# Patient Record
Sex: Male | Born: 1990 | Race: White | Hispanic: No | Marital: Single | Smoking: Current every day smoker
Health system: Southern US, Community
[De-identification: ages and names within clinical notes are randomized; demographics above are authoritative.]

## PROBLEM LIST (undated history)

## (undated) DIAGNOSIS — B1920 Unspecified viral hepatitis C without hepatic coma: Secondary | ICD-10-CM

---
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
## 2012-08-09 ENCOUNTER — Emergency Department (HOSPITAL_BASED_OUTPATIENT_CLINIC_OR_DEPARTMENT_OTHER): Payer: Self-pay

## 2012-08-09 ENCOUNTER — Encounter (HOSPITAL_BASED_OUTPATIENT_CLINIC_OR_DEPARTMENT_OTHER): Payer: Self-pay | Admitting: Emergency Medicine

## 2012-08-09 ENCOUNTER — Emergency Department (HOSPITAL_BASED_OUTPATIENT_CLINIC_OR_DEPARTMENT_OTHER)
Admission: EM | Admit: 2012-08-09 | Discharge: 2012-08-09 | Disposition: A | Discharge status: Home / Self Care | Payer: Self-pay | Attending: Emergency Medicine | Admitting: Emergency Medicine

## 2012-08-09 DIAGNOSIS — R071 Chest pain on breathing: Secondary | ICD-10-CM | POA: Insufficient documentation

## 2012-08-09 DIAGNOSIS — R079 Chest pain, unspecified: Secondary | ICD-10-CM

## 2012-08-09 DIAGNOSIS — Z8619 Personal history of other infectious and parasitic diseases: Secondary | ICD-10-CM | POA: Insufficient documentation

## 2012-08-09 DIAGNOSIS — F17200 Nicotine dependence, unspecified, uncomplicated: Secondary | ICD-10-CM | POA: Insufficient documentation

## 2012-08-09 HISTORY — DX: Unspecified viral hepatitis C without hepatic coma: B19.20

## 2012-08-09 LAB — COMPREHENSIVE METABOLIC PANEL
ALT: 55 U/L — ABNORMAL HIGH (ref 0–53)
AST: 31 U/L (ref 0–37)
Calcium: 9.9 mg/dL (ref 8.4–10.5)
GFR calc Af Amer: >90 mL/min (ref >90)
Glucose, Bld: 91 mg/dL (ref 70–99)
Sodium: 140 mEq/L (ref 135–145)
Total Protein: 7.5 g/dL (ref 6.0–8.3)

## 2012-08-09 LAB — CBC WITH DIFFERENTIAL/PLATELET
Basophils Absolute: 0 10*3/uL (ref 0.0–0.1)
Eosinophils Absolute: 0.2 10*3/uL (ref 0.0–0.7)
Eosinophils Relative: 2 % (ref 0–5)
MCH: 29.9 pg (ref 26.0–34.0)
MCHC: 36.8 g/dL — ABNORMAL HIGH (ref 30.0–36.0)
MCV: 81.2 fL (ref 78.0–100.0)
Platelets: 230 10*3/uL (ref 150–400)
RDW: 11.9 % (ref 11.5–15.5)

## 2012-08-09 MED ORDER — ASPIRIN 81 MG PO CHEW
324.0000 mg | CHEWABLE_TABLET | Freq: Once | ORAL | Status: AC
  Administered 2012-08-09: 324 mg via ORAL
  Filled 2012-08-09: qty 4

## 2012-08-09 NOTE — ED Notes (Signed)
Chest pain x 11/2-2 months.  Denies radiation,diaphoresis, nausea  Or  vomiting

## 2012-08-09 NOTE — ED Provider Notes (Signed)
History     CSN: [id number]  Arrival date & time 08/09/12  0023   First MD Initiated Contact with Patient 08/09/12 0050      Chief Complaint  Patient presents with  . Chest Pain    (Consider location/radiation/quality/duration/timing/severity/associated sxs/prior treatment) HPI This is a 21 year old male with about a two-month history of episodic chest pain. The chest pain is located in the left lower chest. It is well localized. It is described as sharp. He knows of nothing that causes it to happen and nothing that causes it to improve. It is sometimes accompanied by palpitations, the sense that his heart is pounding. He is equivocal about there being associated shortness of breath. He is equivocal about there being associated lightheadedness. He denies radiation. He denies diaphoresis. He denies nausea or vomiting. He states the episodes last anywhere from minutes to hours. He is here this evening because he had an episode more severe than previous episodes; symptoms are usually mild to moderate but were more intense this time. It lasted from about 10 PM yesterday evening to about 12:30 this morning.  Past Medical History  Diagnosis Date  . Hepatitis C     History reviewed. No pertinent past surgical history.  No family history on file.  History  Substance Use Topics  . Smoking status: Current Every Day Smoker  . Smokeless tobacco: Not on file  . Alcohol Use: Yes      Review of Systems  All other systems reviewed and are negative.    Allergies  Review of patient's allergies indicates no known allergies.  Home Medications  No current outpatient prescriptions on file.  BP 122/61  Pulse 58  Temp 97.7 F (36.5 C) (Oral)  Resp 20  Ht 6\' 2"  (1.88 m)  Wt 165 lb (74.844 kg)  BMI 21.18 kg/m2  SpO2 100%  Physical Exam General: Well-developed, well-nourished male in no acute distress; appearance consistent with age of record HENT: normocephalic, atraumatic Eyes:  pupils equal round and reactive to light; extraocular muscles intact Neck: supple Heart: regular rate and rhythm; no murmurs, rubs or gallops Lungs: clear to auscultation bilaterally Chest: Nontender Abdomen: soft; nondistended; nontender; no masses or hepatosplenomegaly; bowel sounds present Extremities: No deformity; full range of motion; pulses normal Neurologic: Awake, alert and oriented; motor function intact in all extremities and symmetric; no facial droop Skin: Warm and dry Psychiatric: Normal mood and affect    ED Course  Procedures (including critical care time)    MDM   Nursing notes and vitals signs, including pulse oximetry, reviewed.  Summary of this visit's results, reviewed by myself:  Labs:  Results for orders placed during the hospital encounter of 08/09/12 (from the past 24 hour(s))  TROPONIN I     Status: Normal   Collection Time   08/09/12 12:51 AM      Component Value Range   Troponin I <0.30  <0.30 ng/mL  CBC WITH DIFFERENTIAL     Status: Abnormal   Collection Time   08/09/12 12:51 AM      Component Value Range   WBC 7.1  4.0 - 10.5 K/uL   RBC 4.95  4.22 - 5.81 MIL/uL   Hemoglobin 14.8  13.0 - 17.0 g/dL   HCT 14.7  82.9 - 56.2 %   MCV 81.2  78.0 - 100.0 fL   MCH 29.9  26.0 - 34.0 pg   MCHC 36.8 (*) 30.0 - 36.0 g/dL   RDW 13.0  86.5 - 78.4 %  Platelets 230  150 - 400 K/uL   Neutrophils Relative 50  43 - 77 %   Neutro Abs 3.6  1.7 - 7.7 K/uL   Lymphocytes Relative 41  12 - 46 %   Lymphs Abs 2.9  0.7 - 4.0 K/uL   Monocytes Relative 7  3 - 12 %   Monocytes Absolute 0.5  0.1 - 1.0 K/uL   Eosinophils Relative 2  0 - 5 %   Eosinophils Absolute 0.2  0.0 - 0.7 K/uL   Basophils Relative 0  0 - 1 %   Basophils Absolute 0.0  0.0 - 0.1 K/uL  COMPREHENSIVE METABOLIC PANEL     Status: Abnormal   Collection Time   08/09/12 12:51 AM      Component Value Range   Sodium 140  135 - 145 mEq/L   Potassium 3.4 (*) 3.5 - 5.1 mEq/L   Chloride 102  96 - 112  mEq/L   CO2 25  19 - 32 mEq/L   Glucose, Bld 91  70 - 99 mg/dL   BUN 10  6 - 23 mg/dL   Creatinine, Ser 1.91  0.50 - 1.35 mg/dL   Calcium 9.9  8.4 - 47.8 mg/dL   Total Protein 7.5  6.0 - 8.3 g/dL   Albumin 4.4  3.5 - 5.2 g/dL   AST 31  0 - 37 U/L   ALT 55 (*) 0 - 53 U/L   Alkaline Phosphatase 73  39 - 117 U/L   Total Bilirubin 0.4  0.3 - 1.2 mg/dL   GFR calc non Af Amer >90  >90 mL/min   GFR calc Af Amer >90  >90 mL/min  TROPONIN I     Status: Normal   Collection Time   08/09/12  2:22 AM      Component Value Range   Troponin I <0.30  <0.30 ng/mL    Imaging Studies: Dg Chest 2 View  08/09/2012  *RADIOLOGY REPORT*  Clinical Data:  Chest pain.  CHEST - 2 VIEW  Comparison: 08/02/2012 chest x-ray at [facility]  Findings: The heart size and mediastinal contours are within normal limits.  Both lungs are clear.  The visualized skeletal structures are unremarkable.  IMPRESSION: No active disease.   Original Report Authenticated By: Irish Lack, M.D.    3:05 AM Asymptomatic in ED. Patient was advised to followup with cardiology for evaluation as his symptoms have been going on for several months.    EKG Interpretation:  Date & Time: 08/09/2012 12:40 AM  Rate: 59  Rhythm: sinus bradycardia  QRS Axis: normal  Intervals: normal  ST/T Wave abnormalities: normal  Conduction Disutrbances:none  Narrative Interpretation:   Old EKG Reviewed: Rate is slower          Hanley Seamen, MD 08/09/12 [phone]

## 2014-11-23 ENCOUNTER — Encounter (HOSPITAL_COMMUNITY): Payer: Self-pay

## 2014-11-23 ENCOUNTER — Emergency Department (HOSPITAL_COMMUNITY)
Admission: EM | Admit: 2014-11-23 | Discharge: 2014-11-23 | Disposition: A | Discharge status: Home / Self Care | Payer: Self-pay | Attending: Emergency Medicine | Admitting: Emergency Medicine

## 2014-11-23 ENCOUNTER — Emergency Department (HOSPITAL_COMMUNITY): Payer: Self-pay

## 2014-11-23 DIAGNOSIS — Z8619 Personal history of other infectious and parasitic diseases: Secondary | ICD-10-CM | POA: Insufficient documentation

## 2014-11-23 DIAGNOSIS — Z720 Tobacco use: Secondary | ICD-10-CM | POA: Insufficient documentation

## 2014-11-23 DIAGNOSIS — Z791 Long term (current) use of non-steroidal anti-inflammatories (NSAID): Secondary | ICD-10-CM | POA: Insufficient documentation

## 2014-11-23 DIAGNOSIS — Y9289 Other specified places as the place of occurrence of the external cause: Secondary | ICD-10-CM | POA: Insufficient documentation

## 2014-11-23 DIAGNOSIS — Y9389 Activity, other specified: Secondary | ICD-10-CM | POA: Insufficient documentation

## 2014-11-23 DIAGNOSIS — S6991XA Unspecified injury of right wrist, hand and finger(s), initial encounter: Secondary | ICD-10-CM | POA: Insufficient documentation

## 2014-11-23 DIAGNOSIS — M79641 Pain in right hand: Secondary | ICD-10-CM

## 2014-11-23 DIAGNOSIS — W228XXA Striking against or struck by other objects, initial encounter: Secondary | ICD-10-CM | POA: Insufficient documentation

## 2014-11-23 DIAGNOSIS — Y998 Other external cause status: Secondary | ICD-10-CM | POA: Insufficient documentation

## 2014-11-23 MED ORDER — NAPROXEN 500 MG PO TABS: 500.0000 mg | ORAL_TABLET | Freq: Two times a day (BID) | ORAL | Status: AC

## 2014-11-23 MED ORDER — NAPROXEN 500 MG PO TABS
500.0000 mg | ORAL_TABLET | Freq: Once | ORAL | Status: AC
  Administered 2014-11-23: 500 mg via ORAL
  Filled 2014-11-23: qty 1

## 2014-11-23 NOTE — ED Provider Notes (Signed)
CSN: [id number]     Arrival date & time 11/23/14  1359 History  This chart was scribed for non-physician practitioner, Joycie Peek, PA-C, working with Richardean Canal, MD, by Ronney Lion, ED Scribe. This patient was seen in room WTR5/WTR5 and the patient's care was started at 2:52 PM.    Chief Complaint  Patient presents with  . Hand Injury   The history is provided by the patient. No language interpreter was used.    HPI Comments: Albert Long is a 24 y.o. male who presents to the Emergency Department complaining of stabbing, 8/10, right hand pain that occurred after patient lost his temper and punched a wall 2 days ago. He states he noticed swelling yesterday, which has gradually worsened over time. Nothing makes it better, and patient denies trying any prior treatment or medication for this.  Movement makes it worse. He denies numbness, weakness or fever.   Past Medical History  Diagnosis Date  . Hepatitis C    History reviewed. No pertinent past surgical history. History reviewed. No pertinent family history. History  Substance Use Topics  . Smoking status: Current Every Day Smoker -- 0.25 packs/day    Types: Cigarettes  . Smokeless tobacco: Not on file  . Alcohol Use: No    Review of Systems  Constitutional: Negative for fever.  Musculoskeletal: Positive for joint swelling and arthralgias.  Neurological: Negative for numbness.  All other systems reviewed and are negative.   Allergies  Tylenol  Home Medications   Prior to Admission medications   Medication Sig Start Date End Date Taking? Authorizing Provider  naproxen (NAPROSYN) 500 MG tablet Take 1 tablet (500 mg total) by mouth 2 (two) times daily. 11/23/14   Gleen Ripberger, PA-C   BP 126/76 mmHg  Pulse 87  Temp(Src) 98.3 F (36.8 C) (Oral)  Resp 18  SpO2 98% Physical Exam  Constitutional: He is oriented to person, place, and time. He appears well-developed and well-nourished. No distress.  HENT:  Head:  Normocephalic and atraumatic.  Eyes: Conjunctivae and EOM are normal.  Neck: Neck supple. No tracheal deviation present.  Cardiovascular: Normal rate, regular rhythm and normal heart sounds.   Pulmonary/Chest: Effort normal and breath sounds normal. No respiratory distress.  Lungs are clear to auscultation bilaterally.   Musculoskeletal: Normal range of motion. He exhibits tenderness.  Right hand: Distal pulses intact. NVI. Full ROM of wrist and all digits. No open wounds. Tenderness diffusely to dorsal aspect of palm near the radial head. Diffuse edema across dorsum of palm. No overt warmth or edema to joint capsule. Distal pulses intact. Brisk cap refill 2+. Full ROM of right elbow and shoulder.  Neurological: He is alert and oriented to person, place, and time.  Skin: Skin is warm and dry.  Psychiatric: He has a normal mood and affect. His behavior is normal.  Nursing note and vitals reviewed.   ED Course  Procedures (including critical care time)  SPLINT APPLICATION Date/Time: 3:20 PM Authorized by: Sharlene Motts Consent: Verbal consent obtained. Risks and benefits: risks, benefits and alternatives were discussed Consent given by: patient Splint applied by: orthopedic technician Location details: Right wrist  Splint type: Cock up wrist  Supplies used: Velcro splint  Post-procedure: The splinted body part was neurovascularly unchanged following the procedure. Patient tolerance: Patient tolerated the procedure well with no immediate complications.   ip DIAGNOSTIC STUDIES: Oxygen Saturation is 98% on room air, normal by my interpretation.    COORDINATION OF CARE: 2:58 PM -  Discussed treatment plan with pt at bedside which includes pain medication, and pt agreed to plan.   Labs Review Labs Reviewed - No data to display  Imaging Review Dg Hand Complete Right  11/23/2014   CLINICAL DATA:  Punched wall Sunday.  Pain  EXAM: RIGHT HAND - COMPLETE 3+ VIEW  COMPARISON:   06/15/2010  FINDINGS: There is no evidence of fracture or dislocation. There is no evidence of arthropathy or other focal bone abnormality. Soft tissues are unremarkable.  IMPRESSION: Negative.   Electronically Signed   By: Marlan Palauharles  Clark M.D.   On: 11/23/2014 14:49   Meds given in ED:  Medications  naproxen (NAPROSYN) tablet 500 mg (500 mg Oral Given 11/23/14 1505)    New Prescriptions   NAPROXEN (NAPROSYN) 500 MG TABLET    Take 1 tablet (500 mg total) by mouth 2 (two) times daily.   Filed Vitals:   11/23/14 1404  BP: 126/76  Pulse: 87  Temp: 98.3 F (36.8 C)  TempSrc: Oral  Resp: 18  SpO2: 98%    MDM  Vitals stable - WNL -afebrile Pt resting comfortably in ED. PE--no evidence of hemarthrosis, septic joint or other vascular compromise. Normal neuro exam Imaging--plain films of right hand and wrist are negative  DDX--patient with soft tissue swelling secondary to punching a wall. No evidence of acute fracture or dislocation. Will place patient in a cockup wrist splint for comfort, discharged with anti-inflammatories, instructions for rice therapy and referral to orthopedics should symptoms persist or worsen.  I discussed all relevant lab findings and imaging results with pt and they verbalized understanding. Discussed f/u with PCP within 48 hrs and return precautions, pt very amenable to plan.  Final diagnoses:  Right hand pain    I personally performed the services described in this documentation, which was scribed in my presence. The recorded information has been reviewed and is accurate.      Joycie PeekBenjamin Tanvir Hipple, PA-C 11/23/14 1521  Richardean Canalavid H Yao, MD 11/23/14 [phone]

## 2014-11-23 NOTE — ED Notes (Signed)
Pt c/o R hand injury after punching a wall x 2 days ago.  Pain score 7/10.  Pt has not taken anything for pain.  Swelling noted.

## 2014-11-23 NOTE — Discharge Instructions (Signed)
Musculoskeletal Pain Musculoskeletal pain is muscle and boney aches and pains. These pains can occur in any part of the body. Your caregiver may treat you without knowing the cause of the pain. They may treat you if blood or urine tests, X-rays, and other tests were normal.  CAUSES There is often not a definite cause or reason for these pains. These pains may be caused by a type of germ (virus). The discomfort may also come from overuse. Overuse includes working out too hard when your body is not fit. Boney aches also come from weather changes. Bone is sensitive to atmospheric pressure changes. HOME CARE INSTRUCTIONS   Ask when your test results will be ready. Make sure you get your test results.  Only take over-the-counter or prescription medicines for pain, discomfort, or fever as directed by your caregiver. If you were given medications for your condition, do not drive, operate machinery or power tools, or sign legal documents for 24 hours. Do not drink alcohol. Do not take sleeping pills or other medications that may interfere with treatment.  Continue all activities unless the activities cause more pain. When the pain lessens, slowly resume normal activities. Gradually increase the intensity and duration of the activities or exercise.  During periods of severe pain, bed rest may be helpful. Lay or sit in any position that is comfortable.  Putting ice on the injured area.  Put ice in a bag.  Place a towel between your skin and the bag.  Leave the ice on for 15 to 20 minutes, 3 to 4 times a day.  Follow up with your caregiver for continued problems and no reason can be found for the pain. If the pain becomes worse or does not go away, it may be necessary to repeat tests or do additional testing. Your caregiver may need to look further for a possible cause. SEEK IMMEDIATE MEDICAL CARE IF:  You have pain that is getting worse and is not relieved by medications.  You develop chest pain  that is associated with shortness or breath, sweating, feeling sick to your stomach (nauseous), or throw up (vomit).  Your pain becomes localized to the abdomen.  You develop any new symptoms that seem different or that concern you. MAKE SURE YOU:   Understand these instructions.  Will watch your condition.  Will get help right away if you are not doing well or get worse. Document Released: 08/27/2005 Document Revised: 11/19/2011 Document Reviewed: 05/01/2013 [facility] Patient Information 2015 [employer], [place]. This information is not intended to replace advice given to you by your health care provider. Make sure you discuss any questions you have with your health care provider.  You were evaluated in the ED today for your hand pain after punching a wall. There does not appear to be an emergent cause for your symptoms at this time. Your x-rays were done and are negative for any broken bones or dislocations. Please take your medications as record for your discomfort and inflammation. Please follow-up with your primary care for further evaluation and management of your symptoms. Return to ED for new or worsening symptoms.

## 2018-07-21 ENCOUNTER — Encounter (HOSPITAL_COMMUNITY): Payer: Self-pay | Admitting: Emergency Medicine

## 2018-07-21 ENCOUNTER — Emergency Department (HOSPITAL_COMMUNITY): Payer: Self-pay

## 2018-07-21 ENCOUNTER — Emergency Department (HOSPITAL_COMMUNITY)
Admission: EM | Admit: 2018-07-21 | Discharge: 2018-07-21 | Disposition: A | Discharge status: Home / Self Care | Payer: Self-pay | Attending: Emergency Medicine | Admitting: Emergency Medicine

## 2018-07-21 DIAGNOSIS — T401X1A Poisoning by heroin, accidental (unintentional), initial encounter: Secondary | ICD-10-CM | POA: Insufficient documentation

## 2018-07-21 DIAGNOSIS — B1710 Acute hepatitis C without hepatic coma: Secondary | ICD-10-CM | POA: Insufficient documentation

## 2018-07-21 DIAGNOSIS — F17210 Nicotine dependence, cigarettes, uncomplicated: Secondary | ICD-10-CM | POA: Insufficient documentation

## 2018-07-21 DIAGNOSIS — W19XXXA Unspecified fall, initial encounter: Secondary | ICD-10-CM | POA: Insufficient documentation

## 2018-07-21 DIAGNOSIS — S0181XA Laceration without foreign body of other part of head, initial encounter: Secondary | ICD-10-CM | POA: Insufficient documentation

## 2018-07-21 DIAGNOSIS — Y998 Other external cause status: Secondary | ICD-10-CM | POA: Insufficient documentation

## 2018-07-21 DIAGNOSIS — S0990XA Unspecified injury of head, initial encounter: Secondary | ICD-10-CM | POA: Insufficient documentation

## 2018-07-21 DIAGNOSIS — N50819 Testicular pain, unspecified: Secondary | ICD-10-CM | POA: Insufficient documentation

## 2018-07-21 DIAGNOSIS — Z23 Encounter for immunization: Secondary | ICD-10-CM | POA: Insufficient documentation

## 2018-07-21 DIAGNOSIS — Y9389 Activity, other specified: Secondary | ICD-10-CM | POA: Insufficient documentation

## 2018-07-21 DIAGNOSIS — Y9289 Other specified places as the place of occurrence of the external cause: Secondary | ICD-10-CM | POA: Insufficient documentation

## 2018-07-21 LAB — RAPID URINE DRUG SCREEN, HOSP PERFORMED
Amphetamines: NOT DETECTED
Barbiturates: NOT DETECTED
Benzodiazepines: NOT DETECTED
Cocaine: NOT DETECTED
Opiates: POSITIVE — AB
Tetrahydrocannabinol: NOT DETECTED

## 2018-07-21 LAB — COMPREHENSIVE METABOLIC PANEL
ALK PHOS: 65 U/L (ref 38–126)
ALT: 95 U/L — AB (ref 0–44)
AST: 45 U/L — AB (ref 15–41)
Albumin: 4.1 g/dL (ref 3.5–5.0)
Anion gap: 9 (ref 5–15)
BUN: 17 mg/dL (ref 6–20)
CALCIUM: 9.4 mg/dL (ref 8.9–10.3)
CHLORIDE: 100 mmol/L (ref 98–111)
CO2: 31 mmol/L (ref 22–32)
CREATININE: 0.9 mg/dL (ref 0.61–1.24)
GFR calc Af Amer: >60 mL/min (ref >60)
GFR calc non Af Amer: >60 mL/min (ref >60)
Glucose, Bld: 111 mg/dL — ABNORMAL HIGH (ref 70–99)
Potassium: 3.3 mmol/L — ABNORMAL LOW (ref 3.5–5.1)
Sodium: 140 mmol/L (ref 135–145)
Total Bilirubin: 0.8 mg/dL (ref 0.3–1.2)
Total Protein: 6.9 g/dL (ref 6.5–8.1)

## 2018-07-21 LAB — CBC WITH DIFFERENTIAL/PLATELET
Abs Immature Granulocytes: 0.03 10*3/uL (ref 0.00–0.07)
Basophils Absolute: 0 10*3/uL (ref 0.0–0.1)
Basophils Relative: 0 %
EOS ABS: 0 10*3/uL (ref 0.0–0.5)
EOS PCT: 0 %
HEMATOCRIT: 42.5 % (ref 39.0–52.0)
HEMOGLOBIN: 14.4 g/dL (ref 13.0–17.0)
Immature Granulocytes: 0 %
LYMPHS ABS: 1.2 10*3/uL (ref 0.7–4.0)
LYMPHS PCT: 12 %
MCH: 30.3 pg (ref 26.0–34.0)
MCHC: 33.9 g/dL (ref 30.0–36.0)
MCV: 89.3 fL (ref 80.0–100.0)
MONO ABS: 0.9 10*3/uL (ref 0.1–1.0)
Monocytes Relative: 9 %
Neutro Abs: 7.8 10*3/uL — ABNORMAL HIGH (ref 1.7–7.7)
Neutrophils Relative %: 79 %
Platelets: 157 10*3/uL (ref 150–400)
RBC: 4.76 MIL/uL (ref 4.22–5.81)
RDW: 11.9 % (ref 11.5–15.5)
WBC: 10 10*3/uL (ref 4.0–10.5)
nRBC: 0 % (ref 0.0–0.2)

## 2018-07-21 LAB — URINALYSIS, ROUTINE W REFLEX MICROSCOPIC
BILIRUBIN URINE: NEGATIVE
Bacteria, UA: NONE SEEN
Glucose, UA: NEGATIVE mg/dL
HGB URINE DIPSTICK: NEGATIVE
Ketones, ur: 5 mg/dL — AB
LEUKOCYTES UA: NEGATIVE
NITRITE: NEGATIVE
PH: 6 (ref 5.0–8.0)
Protein, ur: 30 mg/dL — AB
SPECIFIC GRAVITY, URINE: 1.018 (ref 1.005–1.030)

## 2018-07-21 LAB — ETHANOL

## 2018-07-21 MED ORDER — NALOXONE HCL 4 MG/0.1ML NA LIQD
1.0000 | Freq: Once | NASAL | Status: AC | PRN
  Administered 2018-07-21: 1 via NASAL
  Filled 2018-07-21: qty 4

## 2018-07-21 MED ORDER — TETANUS-DIPHTH-ACELL PERTUSSIS 5-2.5-18.5 LF-MCG/0.5 IM SUSP
0.5000 mL | Freq: Once | INTRAMUSCULAR | Status: AC
  Administered 2018-07-21: 0.5 mL via INTRAMUSCULAR
  Filled 2018-07-21: qty 0.5

## 2018-07-21 MED ORDER — LIDOCAINE HCL (PF) 1 % IJ SOLN
5.0000 mL | Freq: Once | INTRAMUSCULAR | Status: AC
  Administered 2018-07-21: 5 mL
  Filled 2018-07-21: qty 30

## 2018-07-21 NOTE — ED Triage Notes (Signed)
Pt found unresponsive in bathroom, fall and lac to right eye , pt admits to using heroin. Pt given 1 mg narcan, 4 zofran, 18 in LAC.  Vs 133/93, hr 102, rr18, spo2 96 room air.

## 2018-07-21 NOTE — ED Notes (Signed)
Bed: RESA Expected date:  Expected time:  Means of arrival:  Comments: 27 yo heroin overdose

## 2018-07-21 NOTE — Discharge Instructions (Addendum)
Follow-up with urology for the testicle pain. Sutures out in 7 days

## 2018-07-21 NOTE — ED Provider Notes (Signed)
[facility]-EMERGENCY DEPT Provider Note   CSN: [id number] Arrival date & time: 07/21/18  2014     History   Chief Complaint Chief Complaint  Patient presents with  . Drug Overdose    heroin od     HPI Albert Long is a 27 y.o. male.  HPI Patient presents after drug overdose.  States he was in the bathroom and used heroin.  Found unresponsive.  States he had been in jail and has not used heroin much recently.  Injected into his arm.  Laceration above right eye.  States he has pain in his head.  Is not sure his last tetanus shot.  History of hepatitis C.  Denies suicidal homicidal thoughts. Past Medical History:  Diagnosis Date  . Hepatitis C     There are no active problems to display for this patient.   History reviewed. No pertinent surgical history.      Home Medications    Prior to Admission medications   Medication Sig Start Date End Date Taking? Authorizing Provider  naproxen (NAPROSYN) 500 MG tablet Take 1 tablet (500 mg total) by mouth 2 (two) times daily. 11/23/14   Joycie Peek, PA-C    Family History No family history on file.  Social History Social History   Tobacco Use  . Smoking status: Current Every Day Smoker    Packs/day: 0.25    Types: Cigarettes  Substance Use Topics  . Alcohol use: No  . Drug use: No     Allergies   Tylenol [acetaminophen]   Review of Systems Review of Systems  Constitutional: Negative for appetite change.  HENT: Negative for congestion.   Respiratory: Negative for shortness of breath.   Cardiovascular: Negative for chest pain.  Gastrointestinal: Negative for abdominal pain.  Genitourinary: Negative for discharge.  Musculoskeletal: Negative for back pain.  Skin: Positive for wound.  Neurological: Negative for weakness.  Psychiatric/Behavioral: Negative for suicidal ideas.     Physical Exam Updated Vital Signs BP 134/73 (BP Location: Right Arm)   Pulse 97   Temp 98.9 F  (37.2 C) (Oral)   Resp 18   Ht 6\' 2"  (1.88 m)   Wt 93 kg   SpO2 98%   BMI 26.32 kg/m   Physical Exam  Constitutional: He is oriented to person, place, and time. He appears well-developed.  HENT:  Head: Normocephalic.  2 cm laceration above right eyebrow.  Tenderness right temple and right cheek.  Eyes: Pupils are equal, round, and reactive to light. EOM are normal.  Neck: Neck supple.  Cardiovascular: Normal rate.  Pulmonary/Chest: He has no wheezes.  Abdominal: There is no tenderness.  Musculoskeletal: He exhibits no edema.  Neurological: He is alert and oriented to person, place, and time.  Skin: Skin is warm. Capillary refill takes less than 2 seconds.     ED Treatments / Results  Labs (all labs ordered are listed, but only abnormal results are displayed) Labs Reviewed  COMPREHENSIVE METABOLIC PANEL - Abnormal; Notable for the following components:      Result Value   Potassium 3.3 (*)    Glucose, Bld 111 (*)    AST 45 (*)    ALT 95 (*)    All other components within normal limits  CBC WITH DIFFERENTIAL/PLATELET - Abnormal; Notable for the following components:   Neutro Abs 7.8 (*)    All other components within normal limits  RAPID URINE DRUG SCREEN, HOSP PERFORMED - Abnormal; Notable for the following components:  Opiates POSITIVE (*)    All other components within normal limits  URINALYSIS, ROUTINE W REFLEX MICROSCOPIC - Abnormal; Notable for the following components:   Ketones, ur 5 (*)    Protein, ur 30 (*)    All other components within normal limits  ETHANOL  RPR  HIV ANTIBODY (ROUTINE TESTING W REFLEX)  GC/CHLAMYDIA PROBE AMP ([place]) NOT AT [facility]    EKG None  Radiology Dg Ribs Unilateral W/chest Left  Result Date: 07/21/2018 CLINICAL DATA:  Chest pain EXAM: LEFT RIBS AND CHEST - 3+ VIEW COMPARISON:  03/25/2013 FINDINGS: No fracture or other bone lesions are seen involving the ribs. There is no evidence of pneumothorax or pleural effusion.  Both lungs are clear. Heart size and mediastinal contours are within normal limits. Radiodense material over the stomach. IMPRESSION: Negative. Electronically Signed   By: Jasmine Pang M.D.   On: 07/21/2018 23:00   Ct Head Wo Contrast  Result Date: 07/21/2018 CLINICAL DATA:  27 year old male status post 1 unwitnessed fall. Head, right orbit laceration. EXAM: CT HEAD WITHOUT CONTRAST CT MAXILLOFACIAL WITHOUT CONTRAST TECHNIQUE: Multidetector CT imaging of the head and maxillofacial structures were performed using the standard protocol without intravenous contrast. Multiplanar CT image reconstructions of the maxillofacial structures were also generated. COMPARISON:  Head CT 03/10/2010, head and face CT 04/20/2008. FINDINGS: CT HEAD FINDINGS Brain: Normal cerebral volume. No midline shift, ventriculomegaly, mass effect, evidence of mass lesion, intracranial hemorrhage or evidence of cortically based acute infarction. Gray-white matter differentiation is within normal limits throughout the brain. Vascular: No suspicious intracranial vascular hyperdensity. Skull: Stable and intact. Other: Periorbital soft tissues described below. No other scalp soft tissue gas. Posterior right scalp contusion or hematoma suspected on series 4, image 69. Possible additional scalp soft tissue injury along the left forehead on image 53. Underlying calvarium intact. CT MAXILLOFACIAL FINDINGS Osseous: Mandible intact. Intact maxilla and zygoma. Nasal bones appear stable and intact. Visible cervical vertebrae appear intact. Orbits: Orbital walls appear stable and intact. Right globe is intact. The intraorbital soft tissues remain normal. Right superior periorbital soft tissue swelling with trace soft tissue gas. Normal left orbit. Sinuses: Paranasal sinuses and mastoids are stable and well pneumatized. Right petrous apex air cells (normal variant). Soft tissues: Negative visible noncontrast larynx, pharynx, parapharyngeal spaces,  retropharyngeal space, sublingual space, submandibular glands, parotid glands, masticator spaces. Negative upper cervical lymph nodes. IMPRESSION: 1. Right periorbital and scattered scalp soft tissue injuries without underlying fracture. No intraorbital injury. 2. Stable and normal noncontrast CT appearance of the brain. Electronically Signed   By: Odessa Fleming M.D.   On: 07/21/2018 20:56   Ct Maxillofacial Wo Contrast  Result Date: 07/21/2018 CLINICAL DATA:  27 year old male status post 1 unwitnessed fall. Head, right orbit laceration. EXAM: CT HEAD WITHOUT CONTRAST CT MAXILLOFACIAL WITHOUT CONTRAST TECHNIQUE: Multidetector CT imaging of the head and maxillofacial structures were performed using the standard protocol without intravenous contrast. Multiplanar CT image reconstructions of the maxillofacial structures were also generated. COMPARISON:  Head CT 03/10/2010, head and face CT 04/20/2008. FINDINGS: CT HEAD FINDINGS Brain: Normal cerebral volume. No midline shift, ventriculomegaly, mass effect, evidence of mass lesion, intracranial hemorrhage or evidence of cortically based acute infarction. Gray-white matter differentiation is within normal limits throughout the brain. Vascular: No suspicious intracranial vascular hyperdensity. Skull: Stable and intact. Other: Periorbital soft tissues described below. No other scalp soft tissue gas. Posterior right scalp contusion or hematoma suspected on series 4, image 69. Possible additional scalp soft tissue injury along  the left forehead on image 53. Underlying calvarium intact. CT MAXILLOFACIAL FINDINGS Osseous: Mandible intact. Intact maxilla and zygoma. Nasal bones appear stable and intact. Visible cervical vertebrae appear intact. Orbits: Orbital walls appear stable and intact. Right globe is intact. The intraorbital soft tissues remain normal. Right superior periorbital soft tissue swelling with trace soft tissue gas. Normal left orbit. Sinuses: Paranasal sinuses  and mastoids are stable and well pneumatized. Right petrous apex air cells (normal variant). Soft tissues: Negative visible noncontrast larynx, pharynx, parapharyngeal spaces, retropharyngeal space, sublingual space, submandibular glands, parotid glands, masticator spaces. Negative upper cervical lymph nodes. IMPRESSION: 1. Right periorbital and scattered scalp soft tissue injuries without underlying fracture. No intraorbital injury. 2. Stable and normal noncontrast CT appearance of the brain. Electronically Signed   By: Odessa Fleming M.D.   On: 07/21/2018 20:56    Procedures .Marland KitchenLaceration Repair Date/Time: 07/21/2018 11:24 PM Performed by: Benjiman Core, MD Authorized by: Benjiman Core, MD   Consent:    Consent obtained:  Verbal   Consent given by:  Patient   Risks discussed:  Infection, need for additional repair, poor cosmetic result, pain, retained foreign body and poor wound healing   Alternatives discussed:  No treatment Anesthesia (see MAR for exact dosages):    Anesthesia method:  Local infiltration   Local anesthetic:  Lidocaine 1% w/o epi Laceration details:    Location:  Face   Face location:  Forehead   Length (cm):  2 Repair type:    Repair type:  Simple Pre-procedure details:    Preparation:  Patient was prepped and draped in usual sterile fashion Exploration:    Wound exploration: wound explored through full range of motion     Contaminated: no   Treatment:    Area cleansed with:  Saline   Amount of cleaning:  Standard Skin repair:    Repair method:  Sutures   Suture size:  5-0   Wound skin closure material used: vicryl rapide.   Suture technique:  Simple interrupted   Number of sutures:  6 Approximation:    Approximation:  Close Post-procedure details:    Patient tolerance of procedure:  Tolerated well, no immediate complications   (including critical care time)  Medications Ordered in ED Medications  naloxone (NARCAN) nasal spray 4 mg/0.1 mL (has no  administration in time range)  Tdap (BOOSTRIX) injection 0.5 mL (0.5 mLs Intramuscular Given 07/21/18 2224)  lidocaine (PF) (XYLOCAINE) 1 % injection 5 mL (5 mLs Infiltration Given by Other 07/21/18 2223)     Initial Impression / Assessment and Plan / ED Course  I have reviewed the triage vital signs and the nursing notes.  Pertinent labs & imaging results that were available during my care of the patient were reviewed by me and considered in my medical decision making (see chart for details).     Patient with heroin overdose.  Accidental.  Now appears to be stable.  Also was complaining of left chest pain.  X-ray reassuring.  Laceration closed.  Head CT reassuring.  Patient and his girlfriend complaining of testicle pain.  States is been going for a year.  No tach clear testicle tenderness.  No penile discharge.  Patient refused ultrasound because there was only a male tech.  Will follow with urology.  Given nasal Narcan to take home for risk reduction.  Discharge home per  Final Clinical Impressions(s) / ED Diagnoses   Final diagnoses:  Accidental overdose of heroin, initial encounter (HCC)  Testicle pain  ED Discharge Orders    None       Benjiman Core, MD 07/21/18 2326

## 2018-07-21 NOTE — ED Notes (Signed)
Suture cart outside of room with suture tray.

## 2018-07-21 NOTE — ED Notes (Signed)
Pt in Ct  

## 2018-07-22 LAB — HIV ANTIBODY (ROUTINE TESTING W REFLEX): HIV Screen 4th Generation wRfx: NONREACTIVE

## 2018-07-22 LAB — RPR: RPR Ser Ql: NONREACTIVE

## 2018-07-22 LAB — GC/CHLAMYDIA PROBE AMP (~~LOC~~) NOT AT ARMC
Chlamydia: NEGATIVE
Neisseria Gonorrhea: NEGATIVE

## 2018-08-15 ENCOUNTER — Encounter (HOSPITAL_COMMUNITY): Payer: Self-pay | Admitting: Emergency Medicine

## 2018-08-15 ENCOUNTER — Other Ambulatory Visit: Payer: Self-pay

## 2018-08-15 ENCOUNTER — Emergency Department (HOSPITAL_COMMUNITY)
Admission: EM | Admit: 2018-08-15 | Discharge: 2018-08-16 | Disposition: A | Discharge status: Home / Self Care | Payer: Self-pay | Attending: Emergency Medicine | Admitting: Emergency Medicine

## 2018-08-15 ENCOUNTER — Emergency Department (HOSPITAL_COMMUNITY): Payer: Self-pay

## 2018-08-15 DIAGNOSIS — F17210 Nicotine dependence, cigarettes, uncomplicated: Secondary | ICD-10-CM | POA: Insufficient documentation

## 2018-08-15 DIAGNOSIS — L03113 Cellulitis of right upper limb: Secondary | ICD-10-CM | POA: Insufficient documentation

## 2018-08-15 LAB — CBC WITH DIFFERENTIAL/PLATELET
ABS IMMATURE GRANULOCYTES: 0.01 10*3/uL (ref 0.00–0.07)
BASOS ABS: 0 10*3/uL (ref 0.0–0.1)
BASOS PCT: 0 %
Eosinophils Absolute: 0.2 10*3/uL (ref 0.0–0.5)
Eosinophils Relative: 3 %
HCT: 42.4 % (ref 39.0–52.0)
Hemoglobin: 13.9 g/dL (ref 13.0–17.0)
Immature Granulocytes: 0 %
Lymphocytes Relative: 24 %
Lymphs Abs: 1.5 10*3/uL (ref 0.7–4.0)
MCH: 29.1 pg (ref 26.0–34.0)
MCHC: 32.8 g/dL (ref 30.0–36.0)
MCV: 88.9 fL (ref 80.0–100.0)
MONO ABS: 0.6 10*3/uL (ref 0.1–1.0)
Monocytes Relative: 9 %
NEUTROS ABS: 4 10*3/uL (ref 1.7–7.7)
NRBC: 0 % (ref 0.0–0.2)
Neutrophils Relative %: 64 %
PLATELETS: 219 10*3/uL (ref 150–400)
RBC: 4.77 MIL/uL (ref 4.22–5.81)
RDW: 12.1 % (ref 11.5–15.5)
WBC: 6.3 10*3/uL (ref 4.0–10.5)

## 2018-08-15 LAB — BASIC METABOLIC PANEL
Anion gap: 9 (ref 5–15)
BUN: 13 mg/dL (ref 6–20)
CALCIUM: 9.3 mg/dL (ref 8.9–10.3)
CO2: 29 mmol/L (ref 22–32)
CREATININE: 1 mg/dL (ref 0.61–1.24)
Chloride: 100 mmol/L (ref 98–111)
GFR calc Af Amer: >60 mL/min (ref >60)
GLUCOSE: 114 mg/dL — AB (ref 70–99)
Potassium: 4 mmol/L (ref 3.5–5.1)
Sodium: 138 mmol/L (ref 135–145)

## 2018-08-15 LAB — SEDIMENTATION RATE: SED RATE: 18 mm/h — AB (ref 0–16)

## 2018-08-15 LAB — C-REACTIVE PROTEIN: CRP: 8.5 mg/dL — ABNORMAL HIGH (ref <1.0)

## 2018-08-15 MED ORDER — VANCOMYCIN HCL IN DEXTROSE 1-5 GM/200ML-% IV SOLN: 1000.0000 mg | Freq: Once | INTRAVENOUS | Status: DC

## 2018-08-15 MED ORDER — CLINDAMYCIN HCL 150 MG PO CAPS: 450.0000 mg | ORAL_CAPSULE | Freq: Three times a day (TID) | ORAL | 0 refills | Status: AC

## 2018-08-15 MED ORDER — VANCOMYCIN HCL 10 G IV SOLR
1750.0000 mg | Freq: Once | INTRAVENOUS | Status: AC
  Administered 2018-08-15: 1750 mg via INTRAVENOUS
  Filled 2018-08-15: qty 1750

## 2018-08-15 MED ORDER — SODIUM CHLORIDE 0.9 % IV SOLN
2.0000 g | Freq: Once | INTRAVENOUS | Status: AC
  Administered 2018-08-15: 2 g via INTRAVENOUS
  Filled 2018-08-15: qty 20

## 2018-08-15 NOTE — ED Provider Notes (Signed)
Medical screening examination/treatment/procedure(s) were conducted as a shared visit with non-physician practitioner(s) and myself.  I personally evaluated the patient during the encounter.  Clinical Impression:   Final diagnoses:  None    27 year old male known history of IV drug use presents with swelling of his right hand wrist and distal forearm.  There is no focal area of fluctuance, he has a mild redness and swelling to this area and some tenderness with trying to make a fist.  He has good pulses, good capillary refill, normal sensation.  There is no definite drainable abscess, no lymphadenopathy in the epitrochlear or axillary region.  He is not tachycardic, he is overall well-appearing.  At this time he will be treated with antibiotics and follow-up.   Eber HongMiller, Bricelyn Freestone, MD 08/16/18 [phone]

## 2018-08-15 NOTE — Discharge Instructions (Signed)
Take clindamycin as prescribed until finished.  Do not miss any doses.  We recommend that you take this antibiotic with a probiotic.  This can be purchased over-the-counter at your local pharmacy.  Should you desire evaluation regarding the foreign bodies in your arm, follow-up with a hand specialist.  Return to the emergency department if symptoms persist or worsen in your right hand/arm or if you develop a fever over 101F with persistent swelling. Discontinue use of IV drugs.

## 2018-08-15 NOTE — ED Provider Notes (Signed)
[facility] EMERGENCY DEPARTMENT Provider Note   CSN: [id number] Arrival date & time: 08/15/18  2015     History   Chief Complaint Chief Complaint  Patient presents with  . hand swelling    HPI Albert Long is a 27 y.o. male.  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 Patient gave permission to discuss all aspects of this encounter with girlfriend present prior to obtaining history.  Patient reporting 4 days of progressive swelling of the right hand.  This has spread to his wrist and forearm.  He has been taking Tylenol and ibuprofen for symptoms without relief.  Girlfriend reports that she has begun to notice mild redness to the skin.  Denies any history of trauma, fall, other injury.  No known bug bites.  Patient denies using IV drugs of the site of pain and swelling.  Did present for overdose on heroin approximately 1 month ago.  Is not appreciated to be fully forthcoming with his drug history.  Patient denies fever, numbness, paresthesias, weakness, drainage from RUE.     Past Medical History:  Diagnosis Date  . Hepatitis C     There are no active problems to display for this patient.   History reviewed. No pertinent surgical history.      Home Medications    Prior to Admission medications   Medication Sig Start Date End Date Taking? Authorizing Provider  acetaminophen (TYLENOL) 500 MG tablet Take 500-1,000 mg by mouth every 6 (six) hours as needed (for pain or inflammation).   Yes [provider]  clindamycin (CLEOCIN) 150 MG capsule Take 3 capsules (450 mg total) by mouth 3 (three) times daily for 10 days. May dispense as 150mg  capsules 08/15/18 08/25/18  Antony Madura, PA-C  naproxen (NAPROSYN) 500 MG tablet Take 1 tablet (500 mg total) by mouth 2 (two) times daily. Patient not taking: Reported on 08/15/2018 11/23/14   Joycie Peek, PA-C    Family History History  reviewed. No pertinent family history.  Social History Social History   Tobacco Use  . Smoking status: Current Every Day Smoker    Packs/day: 0.25    Types: Cigarettes  . Smokeless tobacco: Current User    Types: Chew  Substance Use Topics  . Alcohol use: No  . Drug use: Yes    Types: IV    Comment: last used heroin today     Allergies   Bee venom and Tylenol [acetaminophen]   Review of Systems Review of Systems Ten systems reviewed and are negative for acute change, except as noted in the HPI.    Physical Exam Updated Vital Signs BP 132/76   Pulse (!) 52   Temp 98.4 F (36.9 C) (Oral)   Resp 16   Ht 6\' 2"  (1.88 m)   Wt 90.7 kg   SpO2 93%   BMI 25.68 kg/m   Physical Exam  Constitutional: He is oriented to person, place, and time. He appears well-developed and well-nourished. No distress.  Nontoxic appearing and in NAD  HENT:  Head: Normocephalic and atraumatic.  Eyes: Conjunctivae and EOM are normal. No scleral icterus.  Neck: Normal range of motion.  Cardiovascular: Normal rate, regular rhythm and intact distal pulses.  2+ distal radial pulse in the RUE. Capillary refill brisk in all digits of the R hand.  Pulmonary/Chest: Effort normal. No stridor. No respiratory distress.  Respirations even and unlabored  Musculoskeletal: Normal range of motion.  Mild pitting edema and soft tissue swelling with mild erythema noted to the distal R forearm and hand. TTP along the radial aspect of the dorsum of the forearm. Mild warmth compared to surrounding skin. No bony deformity or crepitus. No purulent drainage, fluctuance, lymphangitic streaking.  Neurological: He is alert and oriented to person, place, and time. He exhibits normal muscle tone. Coordination normal.  Sensation to light touch intact in the RUE. Normal grips on the right.  Skin: Skin is warm and dry. No rash noted. He is not diaphoretic. No erythema. No pallor.  Psychiatric: He has a normal mood and affect.  His behavior is normal.  Nursing note and vitals reviewed.    ED Treatments / Results  Labs (all labs ordered are listed, but only abnormal results are displayed) Labs Reviewed  C-REACTIVE PROTEIN - Abnormal; Notable for the following components:      Result Value   CRP 8.5 (*)    All other components within normal limits  SEDIMENTATION RATE - Abnormal; Notable for the following components:   Sed Rate 18 (*)    All other components within normal limits  BASIC METABOLIC PANEL - Abnormal; Notable for the following components:   Glucose, Bld 114 (*)    All other components within normal limits  CBC WITH DIFFERENTIAL/PLATELET    EKG None  Radiology Dg Forearm Right  Result Date: 08/15/2018 CLINICAL DATA:  Hand pain and swelling, known splinter. EXAM: RIGHT HAND - COMPLETE 3+ VIEW; RIGHT FOREARM - 2 VIEW COMPARISON:  None. FINDINGS: RIGHT hand: There is no evidence of fracture or dislocation. There is no evidence of arthropathy or other focal bone abnormality. Dorsal hand soft tissue swelling without subcutaneous gas or radiopaque foreign bodies. RIGHT forearm: No acute fracture deformity or dislocation. No destructive bony lesions. Subcentimeter wire like foreign bodies projecting in antecubital fossa best seen on lateral radiograph. IMPRESSION: 1. Dorsal hand soft tissue swelling without radiopaque foreign bodies or acute osseous process. 2. Foreign bodies projecting in antecubital fossa, possible needle fragments. Electronically Signed   By: Awilda Metro M.D.   On: 08/15/2018 21:32   Dg Hand Complete Right  Result Date: 08/15/2018 CLINICAL DATA:  Hand pain and swelling, known splinter. EXAM: RIGHT HAND - COMPLETE 3+ VIEW; RIGHT FOREARM - 2 VIEW COMPARISON:  None. FINDINGS: RIGHT hand: There is no evidence of fracture or dislocation. There is no evidence of arthropathy or other focal bone abnormality. Dorsal hand soft tissue swelling without subcutaneous gas or radiopaque foreign  bodies. RIGHT forearm: No acute fracture deformity or dislocation. No destructive bony lesions. Subcentimeter wire like foreign bodies projecting in antecubital fossa best seen on lateral radiograph. IMPRESSION: 1. Dorsal hand soft tissue swelling without radiopaque foreign bodies or acute osseous process. 2. Foreign bodies projecting in antecubital fossa, possible needle fragments. Electronically Signed   By: Awilda Metro M.D.   On: 08/15/2018 21:32    Procedures Procedures (including critical care time)  Medications Ordered in ED Medications  vancomycin (VANCOCIN) 1,750 mg in sodium chloride 0.9 % 500 mL IVPB (1,750 mg Intravenous New Bag/Given 08/15/18 2348)  cefTRIAXone (ROCEPHIN) 2 g in sodium chloride 0.9 % 100 mL IVPB (0 g Intravenous Stopped 08/15/18 2330)     Initial Impression / Assessment and Plan / ED Course  I have reviewed the triage vital signs and the nursing notes.  Pertinent labs & imaging results that were available during my care of the patient were reviewed by me  and considered in my medical decision making (see chart for details).     27 year old male presents to the emergency department for evaluation of pain and swelling in his distal right forearm and hand.  Symptoms have been progressing over the past 4 days with mild associated erythema and warmth.  He has a history of IV drug use with presentation to the ED 1 month ago for heroin overdose.  Ultimately, there is concern that patient is developing a cellulitis at the site of prior IV drug use.  He is neurovascularly intact on physical exam.  No fluctuance or evidence of discrete abscess.  He is afebrile without leukocytosis in the ED today.  The patient does not meet criteria for SIRS or Sepsis.  X-ray without findings of subcutaneous gas.  He does have evidence of residual retained needle fragments in the right AC.   The patient was given both IV vancomycin and Rocephin while in the ED.  Will discharge on  clindamycin.  He has been instructed to take this antibiotic fully, until completed. Return precautions discussed and provided. Patient discharged in stable condition with no unaddressed concerns.   Final Clinical Impressions(s) / ED Diagnoses   Final diagnoses:  Cellulitis of right upper extremity    ED Discharge Orders         Ordered    clindamycin (CLEOCIN) 150 MG capsule  3 times daily     08/15/18 2336           Antony MaduraHumes, Haelyn Forgey, PA-C 08/16/18 0029    Eber HongMiller, Brian, MD 08/16/18 1521

## 2018-08-15 NOTE — ED Notes (Signed)
ED Provider at bedside. 

## 2018-08-15 NOTE — Progress Notes (Signed)
Pharmacy Antibiotic Note  Kayren EavesDavid A Hallgren is a 27 y.o. male admitted on 08/15/2018 with right hand swelling.  Patient has a history of Hep C and IVDU.  Pharmacy has been consulted for vancomycin dosing.  Baseline labs pending.   Plan: Vanc 1750mg  IV x 1 F/U baseline labs for further dosing  Height: 6\' 2"  (188 cm) Weight: 200 lb (90.7 kg) IBW/kg (Calculated) : 82.2  Temp (24hrs), Avg:98.4 F (36.9 C), Min:98.4 F (36.9 C), Max:98.4 F (36.9 C)  Recent Labs  Lab 08/15/18 2229  WBC 6.3    CrCl cannot be calculated (Patient's most recent lab result is older than the maximum 21 days allowed.).    Allergies  Allergen Reactions  . Bee Venom Anaphylaxis  . Tylenol [Acetaminophen] Other (See Comments)    "Made him cough up blood one year ago"     Kaydence Menard D. Laney Potashang, PharmD, BCPS, BCCCP 08/15/2018, 10:48 PM

## 2018-08-15 NOTE — ED Triage Notes (Signed)
Pt reports swelling to R hand x 3 days. Pt's R hand is severely swollen spreading to wrist and R forearm. Pt also has redness and skin is hot to touch.  Pt denies any recent injury.

## 2018-08-16 NOTE — ED Notes (Signed)
Discharge instructions reviewed with patient. All questions answered. Patient ambulated to vehicle with belongings 

## 2020-03-24 ENCOUNTER — Emergency Department (HOSPITAL_COMMUNITY): Payer: Self-pay

## 2020-03-24 ENCOUNTER — Emergency Department (HOSPITAL_COMMUNITY)
Admission: EM | Admit: 2020-03-24 | Discharge: 2020-03-24 | Disposition: A | Discharge status: Home / Self Care | Payer: Self-pay | Attending: Emergency Medicine | Admitting: Emergency Medicine

## 2020-03-24 ENCOUNTER — Other Ambulatory Visit: Payer: Self-pay

## 2020-03-24 DIAGNOSIS — F17210 Nicotine dependence, cigarettes, uncomplicated: Secondary | ICD-10-CM | POA: Insufficient documentation

## 2020-03-24 DIAGNOSIS — W01198A Fall on same level from slipping, tripping and stumbling with subsequent striking against other object, initial encounter: Secondary | ICD-10-CM | POA: Insufficient documentation

## 2020-03-24 DIAGNOSIS — W19XXXA Unspecified fall, initial encounter: Secondary | ICD-10-CM

## 2020-03-24 DIAGNOSIS — Y939 Activity, unspecified: Secondary | ICD-10-CM | POA: Insufficient documentation

## 2020-03-24 DIAGNOSIS — T401X1A Poisoning by heroin, accidental (unintentional), initial encounter: Secondary | ICD-10-CM | POA: Insufficient documentation

## 2020-03-24 DIAGNOSIS — Y999 Unspecified external cause status: Secondary | ICD-10-CM | POA: Insufficient documentation

## 2020-03-24 DIAGNOSIS — T50901A Poisoning by unspecified drugs, medicaments and biological substances, accidental (unintentional), initial encounter: Secondary | ICD-10-CM

## 2020-03-24 DIAGNOSIS — Y9289 Other specified places as the place of occurrence of the external cause: Secondary | ICD-10-CM | POA: Insufficient documentation

## 2020-03-24 LAB — CBC WITH DIFFERENTIAL/PLATELET
Abs Immature Granulocytes: 0.01 10*3/uL (ref 0.00–0.07)
Basophils Absolute: 0 10*3/uL (ref 0.0–0.1)
Basophils Relative: 0 %
Eosinophils Absolute: 0 10*3/uL (ref 0.0–0.5)
Eosinophils Relative: 0 %
HCT: 39.8 % (ref 39.0–52.0)
Hemoglobin: 14.1 g/dL (ref 13.0–17.0)
Immature Granulocytes: 0 %
Lymphocytes Relative: 13 %
Lymphs Abs: 0.7 10*3/uL (ref 0.7–4.0)
MCH: 30.7 pg (ref 26.0–34.0)
MCHC: 35.4 g/dL (ref 30.0–36.0)
MCV: 86.7 fL (ref 80.0–100.0)
Monocytes Absolute: 0.5 10*3/uL (ref 0.1–1.0)
Monocytes Relative: 10 %
Neutro Abs: 4.1 10*3/uL (ref 1.7–7.7)
Neutrophils Relative %: 77 %
Platelets: 187 10*3/uL (ref 150–400)
RBC: 4.59 MIL/uL (ref 4.22–5.81)
RDW: 12.6 % (ref 11.5–15.5)
WBC: 5.4 10*3/uL (ref 4.0–10.5)
nRBC: 0 % (ref 0.0–0.2)

## 2020-03-24 LAB — COMPREHENSIVE METABOLIC PANEL
ALT: 94 U/L — ABNORMAL HIGH (ref 0–44)
AST: 67 U/L — ABNORMAL HIGH (ref 15–41)
Albumin: 4.2 g/dL (ref 3.5–5.0)
Alkaline Phosphatase: 51 U/L (ref 38–126)
Anion gap: 7 (ref 5–15)
BUN: 16 mg/dL (ref 6–20)
CO2: 27 mmol/L (ref 22–32)
Calcium: 8.8 mg/dL — ABNORMAL LOW (ref 8.9–10.3)
Chloride: 103 mmol/L (ref 98–111)
Creatinine, Ser: 0.86 mg/dL (ref 0.61–1.24)
GFR calc Af Amer: >60 mL/min (ref >60)
GFR calc non Af Amer: >60 mL/min (ref >60)
Glucose, Bld: 115 mg/dL — ABNORMAL HIGH (ref 70–99)
Potassium: 4 mmol/L (ref 3.5–5.1)
Sodium: 137 mmol/L (ref 135–145)
Total Bilirubin: 1 mg/dL (ref 0.3–1.2)
Total Protein: 6.9 g/dL (ref 6.5–8.1)

## 2020-03-24 LAB — RAPID URINE DRUG SCREEN, HOSP PERFORMED
Amphetamines: NOT DETECTED
Barbiturates: NOT DETECTED
Benzodiazepines: NOT DETECTED
Cocaine: POSITIVE — AB
Opiates: NOT DETECTED
Tetrahydrocannabinol: NOT DETECTED

## 2020-03-24 LAB — ETHANOL: Alcohol, Ethyl (B): <10 mg/dL (ref <10)

## 2020-03-24 MED ORDER — SODIUM CHLORIDE 0.9 % IV BOLUS
1000.0000 mL | Freq: Once | INTRAVENOUS | Status: AC
  Administered 2020-03-24: 1000 mL via INTRAVENOUS

## 2020-03-24 NOTE — ED Triage Notes (Signed)
BIBA  Per EMS: Pt coming in from [place] bathroom, lying on floor, needle was still in arm at time of arrival.  Pt breathing 2-4/min so Fire dept gave 2 mg Narcan Intranasal Pt now A&O X 4 C/O pain to bridge of nose & forehead.  Hematoma and lac above eyebrow.  Lac on nose  Pt states using heroin  Pt states homeless   Vitals  134/84 RR 16 96 room air  18 L AC
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
## 2020-03-24 NOTE — ED Notes (Signed)
Pts lacerations on face cleaned with wound cleanser, gauze.

## 2020-03-24 NOTE — Discharge Instructions (Addendum)
Stop using drugs or you will die.  Follow-up with Dr. Suszanne Conners for your nose if you have any problems or it looks unusual.

## 2020-03-24 NOTE — ED Notes (Signed)
Pt transported to CT ?

## 2021-02-21 ENCOUNTER — Emergency Department (HOSPITAL_COMMUNITY)
Admission: EM | Admit: 2021-02-21 | Discharge: 2021-02-21 | Disposition: A | Discharge status: Home / Self Care | Payer: Self-pay | Attending: Student | Admitting: Student

## 2021-02-21 ENCOUNTER — Encounter (HOSPITAL_COMMUNITY): Payer: Self-pay | Admitting: *Deleted

## 2021-02-21 ENCOUNTER — Emergency Department (HOSPITAL_COMMUNITY): Payer: Self-pay

## 2021-02-21 DIAGNOSIS — R600 Localized edema: Secondary | ICD-10-CM | POA: Insufficient documentation

## 2021-02-21 DIAGNOSIS — R059 Cough, unspecified: Secondary | ICD-10-CM | POA: Insufficient documentation

## 2021-02-21 DIAGNOSIS — R200 Anesthesia of skin: Secondary | ICD-10-CM | POA: Insufficient documentation

## 2021-02-21 DIAGNOSIS — Z5321 Procedure and treatment not carried out due to patient leaving prior to being seen by health care provider: Secondary | ICD-10-CM | POA: Insufficient documentation

## 2021-02-21 LAB — COMPREHENSIVE METABOLIC PANEL
ALT: 57 U/L — ABNORMAL HIGH (ref 0–44)
AST: 33 U/L (ref 15–41)
Albumin: 3.9 g/dL (ref 3.5–5.0)
Alkaline Phosphatase: 52 U/L (ref 38–126)
Anion gap: 9 (ref 5–15)
BUN: 16 mg/dL (ref 6–20)
CO2: 28 mmol/L (ref 22–32)
Calcium: 9.5 mg/dL (ref 8.9–10.3)
Chloride: 98 mmol/L (ref 98–111)
Creatinine, Ser: 0.96 mg/dL (ref 0.61–1.24)
GFR, Estimated: >60 mL/min (ref >60)
Glucose, Bld: 99 mg/dL (ref 70–99)
Potassium: 3.8 mmol/L (ref 3.5–5.1)
Sodium: 135 mmol/L (ref 135–145)
Total Bilirubin: 0.3 mg/dL (ref 0.3–1.2)
Total Protein: 7.3 g/dL (ref 6.5–8.1)

## 2021-02-21 LAB — CBC WITH DIFFERENTIAL/PLATELET
Abs Immature Granulocytes: 0.01 10*3/uL (ref 0.00–0.07)
Basophils Absolute: 0 10*3/uL (ref 0.0–0.1)
Basophils Relative: 0 %
Eosinophils Absolute: 0.1 10*3/uL (ref 0.0–0.5)
Eosinophils Relative: 2 %
HCT: 40.7 % (ref 39.0–52.0)
Hemoglobin: 13.6 g/dL (ref 13.0–17.0)
Immature Granulocytes: 0 %
Lymphocytes Relative: 37 %
Lymphs Abs: 2.2 10*3/uL (ref 0.7–4.0)
MCH: 29 pg (ref 26.0–34.0)
MCHC: 33.4 g/dL (ref 30.0–36.0)
MCV: 86.8 fL (ref 80.0–100.0)
Monocytes Absolute: 0.4 10*3/uL (ref 0.1–1.0)
Monocytes Relative: 7 %
Neutro Abs: 3.2 10*3/uL (ref 1.7–7.7)
Neutrophils Relative %: 54 %
Platelets: 328 10*3/uL (ref 150–400)
RBC: 4.69 MIL/uL (ref 4.22–5.81)
RDW: 12.6 % (ref 11.5–15.5)
WBC: 6 10*3/uL (ref 4.0–10.5)
nRBC: 0 % (ref 0.0–0.2)

## 2021-02-21 LAB — TROPONIN I (HIGH SENSITIVITY)
Troponin I (High Sensitivity): 3 ng/L (ref <18)
Troponin I (High Sensitivity): 3 ng/L (ref <18)

## 2021-02-21 NOTE — ED Provider Notes (Signed)
Emergency Medicine Provider Triage Evaluation Note  Albert Long , a 30 y.o. male  was evaluated in triage.  Pt complains of dyspnea intermittently for the past few months. Patient reports intermittent dyspnea, intermittent chest pain (central ache w/o alleviating/aggravating factors), and cough. Cough is productive at times. Over the past few days he thought his ankles looked swollen- this has improved with elevation. Current IVDU.   Review of Systems  Positive: Chest pain, dyspnea, cough, ankle swelling.  Negative: Vomiting, syncope, hemoptysis.   Physical Exam  BP 136/85   Pulse (!) 116   Temp 98.2 F (36.8 C) (Oral)   Resp 20   SpO2 98%  Gen:   Awake, no distress   Resp:  Normal effort, CTA MSK:   Moves extremities without difficulty  Other:  Heart: Mild tachycardia, regular rhythm.   Medical Decision Making  Medically screening exam initiated at 1:25 AM.  Appropriate orders placed.  Kayren Eaves was informed that the remainder of the evaluation will be completed by another provider, this initial triage assessment does not replace that evaluation, and the importance of remaining in the ED until their evaluation is complete.  Dyspnea.    Desmond Lope 02/21/21 0127    Marily Memos, MD 02/21/21 [phone]

## 2021-02-21 NOTE — ED Notes (Signed)
Pt didn't answer when called for vitals recheck

## 2021-02-21 NOTE — ED Triage Notes (Signed)
Pt has multiple complaints. Reports having productive cough, leg swelling, and numbness to his body. No acute distress is noted at triage.

## 2021-03-21 ENCOUNTER — Other Ambulatory Visit: Payer: Self-pay

## 2021-03-21 ENCOUNTER — Emergency Department (HOSPITAL_COMMUNITY)
Admission: EM | Admit: 2021-03-21 | Discharge: 2021-03-21 | Disposition: A | Discharge status: Home / Self Care | Payer: Self-pay | Attending: Emergency Medicine | Admitting: Emergency Medicine

## 2021-03-21 DIAGNOSIS — H7291 Unspecified perforation of tympanic membrane, right ear: Secondary | ICD-10-CM | POA: Insufficient documentation

## 2021-03-21 DIAGNOSIS — F17210 Nicotine dependence, cigarettes, uncomplicated: Secondary | ICD-10-CM | POA: Insufficient documentation

## 2021-03-21 NOTE — ED Triage Notes (Signed)
Pt c/o right ear pain and hearing loss ongoing for two weeks. States he has been trying to clean it with Q-tips without relief. Denies trauma/injury. Along with chills and suspected fevers.   States he his legs gave out at work and felt dizzy. "It felt like I was drowning." Feels like he lost his balance. Denies injury from incident.

## 2021-03-21 NOTE — Discharge Instructions (Addendum)
You ruptured your right eardrum, please do not stick any foreign bodies in your ear.  Do not get water in your ear as this can cause infection.  You must follow-up with ENT in 3 days time for reevaluation.  Come back to the emergency department if you develop chest pain, shortness of breath, severe abdominal pain, uncontrolled nausea, vomiting, diarrhea.

## 2021-03-21 NOTE — ED Provider Notes (Signed)
[facility] EMERGENCY DEPARTMENT Provider Note   CSN: [id number] Arrival date & time: 03/21/21  1930     History Chief Complaint  Patient presents with   Ear Pain    Right    Albert Long is a 30 y.o. male.  HPI  Patient presents with chief complaint of right ear pain.  Patient states over last 2 weeks he has been having some ear pain, but today he heard something pop and then felt as if something  Was draining out of his ear.  He states since then he has been having decreased hearing, denies discharges or drainage coming from the ear, has no fevers, chills, nasal congestion, sore throat, cough, general body aches.  He endorses that he stuck a Q-tip in his ear but does not think he have to touch his ear.  He is never had any ear problems in the past.  Does not endorse chest pain, shortness breath or abdominal pain.  Past Medical History:  Diagnosis Date   Hepatitis C     There are no problems to display for this patient.   No past surgical history on file.     No family history on file.  Social History   Tobacco Use   Smoking status: Every Day    Packs/day: 0.25    Pack years: 0.00    Types: Cigarettes   Smokeless tobacco: Current    Types: Chew  Vaping Use   Vaping Use: Never used  Substance Use Topics   Alcohol use: No   Drug use: Yes    Types: IV    Comment: last used heroin today    Home Medications Prior to Admission medications   Medication Sig Start Date End Date Taking? Authorizing Provider  naproxen (NAPROSYN) 500 MG tablet Take 1 tablet (500 mg total) by mouth 2 (two) times daily. Patient not taking: Reported on 08/15/2018 11/23/14   Joycie Peek, PA-C    Allergies    Bee venom and Tylenol [acetaminophen]  Review of Systems   Review of Systems  Constitutional:  Negative for chills and fever.  HENT:  Positive for ear pain. Negative for congestion, ear discharge, facial swelling and sore throat.   Respiratory:   Negative for shortness of breath.   Cardiovascular:  Negative for chest pain.  Gastrointestinal:  Negative for abdominal pain.  Genitourinary:  Negative for enuresis.  Musculoskeletal:  Negative for back pain.  Skin:  Negative for rash.  Neurological:  Negative for dizziness.  Hematological:  Does not bruise/bleed easily.   Physical Exam Updated Vital Signs BP 129/84 (BP Location: Right Arm)   Pulse (!) 105   Temp 98.1 F (36.7 C) (Oral)   Resp 16   Ht 6\' 2"  (1.88 m)   Wt 86.2 kg   SpO2 99%   BMI 24.39 kg/m   Physical Exam Vitals and nursing note reviewed.  Constitutional:      General: He is not in acute distress.    Appearance: He is not ill-appearing.  HENT:     Head: Normocephalic and atraumatic.     Left Ear: Tympanic membrane, ear canal and external ear normal.     Ears:     Comments: Patient had a perforated right TM in the center, TM was nonerythematous, no drainage or discharge present, ear canal had no gross abnormalities present.  There is no ear protrusion mastoids nontender to palpation.    Nose: No congestion.     Mouth/Throat:  Mouth: Mucous membranes are moist.     Pharynx: Oropharynx is clear. No oropharyngeal exudate or posterior oropharyngeal erythema.  Eyes:     Conjunctiva/sclera: Conjunctivae normal.  Cardiovascular:     Rate and Rhythm: Normal rate and regular rhythm.     Pulses: Normal pulses.  Pulmonary:     Breath sounds: Normal breath sounds. No rhonchi.  Skin:    General: Skin is warm and dry.  Neurological:     Mental Status: He is alert.  Psychiatric:        Mood and Affect: Mood normal.    ED Results / Procedures / Treatments   Labs (all labs ordered are listed, but only abnormal results are displayed) Labs Reviewed - No data to display  EKG None  Radiology No results found.  Procedures Procedures   Medications Ordered in ED Medications - No data to display  ED Course  I have reviewed the triage vital signs and the  nursing notes.  Pertinent labs & imaging results that were available during my care of the patient were reviewed by me and considered in my medical decision making (see chart for details).    MDM Rules/Calculators/A&P                         Initial impression-patient presents with right ear pain.  He is alert, does not appear in acute stress, vital signs reassuring.  Work-up-due to well-appearing patient, benign his exam, further lab and imaging are warranted at this time.  Rule out-low suspicion for otitis media or externa as there is no signs infection present on my exam.  Low suspicion for mastoiditis as mastoids were nontender to palpation.  Low suspicion for URI as patient does not endorse nasal congestion, sore throat, cough, no signs infection present my exam.  Will defer antibiotic treatment as there is no signs of infection present my exam suspect this was mechanical as patient states he stuck a Q-tip in his ear.  Plan-  Right ear pain-suspect from TM perforation, will defer antibiotics as there is no signs of infection, however with ENT for further evaluation, have him avoid getting water in his ears.  Vital signs have remained stable, no indication for hospital admission.  Patient discussed with attending and they agreed with assessment and plan.  Patient given at home care as well strict return precautions.  Patient verbalized that they understood agreed to said plan.  Final Clinical Impression(s) / ED Diagnoses Final diagnoses:  Perforation of right tympanic membrane    Rx / DC Orders ED Discharge Orders     None        Carroll Sage, PA-C 03/21/21 2142    Melene Plan, DO 03/21/21 2320

## 2021-09-27 ENCOUNTER — Emergency Department (HOSPITAL_COMMUNITY)
Admission: EM | Admit: 2021-09-27 | Discharge: 2021-09-27 | Disposition: A | Discharge status: Home / Self Care | Payer: Self-pay | Attending: Emergency Medicine | Admitting: Emergency Medicine

## 2021-09-27 ENCOUNTER — Emergency Department (HOSPITAL_COMMUNITY): Payer: Self-pay

## 2021-09-27 ENCOUNTER — Other Ambulatory Visit: Payer: Self-pay

## 2021-09-27 DIAGNOSIS — S51012A Laceration without foreign body of left elbow, initial encounter: Secondary | ICD-10-CM | POA: Insufficient documentation

## 2021-09-27 DIAGNOSIS — R55 Syncope and collapse: Secondary | ICD-10-CM | POA: Insufficient documentation

## 2021-09-27 DIAGNOSIS — Y92410 Unspecified street and highway as the place of occurrence of the external cause: Secondary | ICD-10-CM | POA: Insufficient documentation

## 2021-09-27 DIAGNOSIS — S60512A Abrasion of left hand, initial encounter: Secondary | ICD-10-CM | POA: Diagnosis not present

## 2021-09-27 DIAGNOSIS — R1012 Left upper quadrant pain: Secondary | ICD-10-CM | POA: Insufficient documentation

## 2021-09-27 DIAGNOSIS — R0789 Other chest pain: Secondary | ICD-10-CM | POA: Insufficient documentation

## 2021-09-27 DIAGNOSIS — S60511A Abrasion of right hand, initial encounter: Secondary | ICD-10-CM | POA: Insufficient documentation

## 2021-09-27 LAB — CBC WITH DIFFERENTIAL/PLATELET
Abs Immature Granulocytes: 0.02 10*3/uL (ref 0.00–0.07)
Basophils Absolute: 0 10*3/uL (ref 0.0–0.1)
Basophils Relative: 0 %
Eosinophils Absolute: 0.2 10*3/uL (ref 0.0–0.5)
Eosinophils Relative: 2 %
HCT: 43.5 % (ref 39.0–52.0)
Hemoglobin: 14.2 g/dL (ref 13.0–17.0)
Immature Granulocytes: 0 %
Lymphocytes Relative: 23 %
Lymphs Abs: 1.7 10*3/uL (ref 0.7–4.0)
MCH: 28.9 pg (ref 26.0–34.0)
MCHC: 32.6 g/dL (ref 30.0–36.0)
MCV: 88.6 fL (ref 80.0–100.0)
Monocytes Absolute: 0.5 10*3/uL (ref 0.1–1.0)
Monocytes Relative: 7 %
Neutro Abs: 5 10*3/uL (ref 1.7–7.7)
Neutrophils Relative %: 68 %
Platelets: 239 10*3/uL (ref 150–400)
RBC: 4.91 MIL/uL (ref 4.22–5.81)
RDW: 12.4 % (ref 11.5–15.5)
WBC: 7.4 10*3/uL (ref 4.0–10.5)
nRBC: 0 % (ref 0.0–0.2)

## 2021-09-27 LAB — BASIC METABOLIC PANEL
Anion gap: 10 (ref 5–15)
BUN: 26 mg/dL — ABNORMAL HIGH (ref 6–20)
CO2: 26 mmol/L (ref 22–32)
Calcium: 9.2 mg/dL (ref 8.9–10.3)
Chloride: 105 mmol/L (ref 98–111)
Creatinine, Ser: 1.25 mg/dL — ABNORMAL HIGH (ref 0.61–1.24)
GFR, Estimated: >60 mL/min (ref >60)
Glucose, Bld: 72 mg/dL (ref 70–99)
Potassium: 4.3 mmol/L (ref 3.5–5.1)
Sodium: 141 mmol/L (ref 135–145)

## 2021-09-27 MED ORDER — LIDOCAINE-EPINEPHRINE (PF) 2 %-1:200000 IJ SOLN
10.0000 mL | Freq: Once | INTRAMUSCULAR | Status: AC
  Administered 2021-09-27: 10 mL
  Filled 2021-09-27: qty 20

## 2021-09-27 MED ORDER — IOHEXOL 300 MG/ML  SOLN
100.0000 mL | Freq: Once | INTRAMUSCULAR | Status: AC | PRN
  Administered 2021-09-27: 100 mL via INTRAVENOUS

## 2021-09-27 MED ORDER — CYCLOBENZAPRINE HCL 10 MG PO TABS: 10.0000 mg | ORAL_TABLET | Freq: Two times a day (BID) | ORAL | 0 refills | Status: AC | PRN

## 2021-09-27 NOTE — Discharge Instructions (Addendum)
Your work-up today was reassuring.  Please take Tylenol and Motrin for the body aches.  Nothing was broken or bleeding internally on exam.  Have the sutures removed in 10 to 14 days, you can come to the ED, in urgent care or follow-up with a primary care doctor. Take the muscle relaxers at night. They can make you drowsy, do not drive on them.
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
## 2021-09-27 NOTE — ED Notes (Signed)
Pt verbalized understanding of d/c instructions, meds and followup care. Denies questions. VSS, no distress noted. Steady gait to exit with all belongings.  ?
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
## 2021-10-07 ENCOUNTER — Emergency Department (HOSPITAL_COMMUNITY): Payer: Self-pay

## 2021-10-07 ENCOUNTER — Encounter (HOSPITAL_COMMUNITY): Payer: Self-pay | Admitting: Emergency Medicine

## 2021-10-07 ENCOUNTER — Inpatient Hospital Stay (HOSPITAL_COMMUNITY)
Admission: EM | Admit: 2021-10-07 | Discharge: 2021-10-09 | DRG: 391 | Discharge status: Against Medical Advice | Payer: Self-pay | Attending: Internal Medicine | Admitting: Internal Medicine

## 2021-10-07 ENCOUNTER — Other Ambulatory Visit: Payer: Self-pay

## 2021-10-07 DIAGNOSIS — Z5329 Procedure and treatment not carried out because of patient's decision for other reasons: Secondary | ICD-10-CM | POA: Diagnosis not present

## 2021-10-07 DIAGNOSIS — R112 Nausea with vomiting, unspecified: Secondary | ICD-10-CM

## 2021-10-07 DIAGNOSIS — R7989 Other specified abnormal findings of blood chemistry: Secondary | ICD-10-CM

## 2021-10-07 DIAGNOSIS — F1120 Opioid dependence, uncomplicated: Secondary | ICD-10-CM

## 2021-10-07 DIAGNOSIS — K7200 Acute and subacute hepatic failure without coma: Secondary | ICD-10-CM | POA: Diagnosis present

## 2021-10-07 DIAGNOSIS — R161 Splenomegaly, not elsewhere classified: Secondary | ICD-10-CM | POA: Diagnosis present

## 2021-10-07 DIAGNOSIS — F17210 Nicotine dependence, cigarettes, uncomplicated: Secondary | ICD-10-CM | POA: Diagnosis present

## 2021-10-07 DIAGNOSIS — K5900 Constipation, unspecified: Principal | ICD-10-CM | POA: Diagnosis present

## 2021-10-07 DIAGNOSIS — B1920 Unspecified viral hepatitis C without hepatic coma: Secondary | ICD-10-CM | POA: Diagnosis present

## 2021-10-07 DIAGNOSIS — R52 Pain, unspecified: Secondary | ICD-10-CM

## 2021-10-07 DIAGNOSIS — R509 Fever, unspecified: Secondary | ICD-10-CM | POA: Diagnosis present

## 2021-10-07 DIAGNOSIS — Z886 Allergy status to analgesic agent status: Secondary | ICD-10-CM

## 2021-10-07 DIAGNOSIS — K760 Fatty (change of) liver, not elsewhere classified: Secondary | ICD-10-CM | POA: Diagnosis present

## 2021-10-07 DIAGNOSIS — Z20822 Contact with and (suspected) exposure to covid-19: Secondary | ICD-10-CM | POA: Diagnosis present

## 2021-10-07 DIAGNOSIS — E860 Dehydration: Secondary | ICD-10-CM

## 2021-10-07 LAB — CBC WITH DIFFERENTIAL/PLATELET
Abs Immature Granulocytes: 0.04 10*3/uL (ref 0.00–0.07)
Basophils Absolute: 0 10*3/uL (ref 0.0–0.1)
Basophils Relative: 0 %
Eosinophils Absolute: 0 10*3/uL (ref 0.0–0.5)
Eosinophils Relative: 0 %
HCT: 38.6 % — ABNORMAL LOW (ref 39.0–52.0)
Hemoglobin: 13.6 g/dL (ref 13.0–17.0)
Immature Granulocytes: 1 %
Lymphocytes Relative: 4 %
Lymphs Abs: 0.3 10*3/uL — ABNORMAL LOW (ref 0.7–4.0)
MCH: 29.4 pg (ref 26.0–34.0)
MCHC: 35.2 g/dL (ref 30.0–36.0)
MCV: 83.5 fL (ref 80.0–100.0)
Monocytes Absolute: 0.4 10*3/uL (ref 0.1–1.0)
Monocytes Relative: 5 %
Neutro Abs: 7 10*3/uL (ref 1.7–7.7)
Neutrophils Relative %: 90 %
Platelets: 170 10*3/uL (ref 150–400)
RBC: 4.62 MIL/uL (ref 4.22–5.81)
RDW: 12.2 % (ref 11.5–15.5)
WBC: 7.8 10*3/uL (ref 4.0–10.5)
nRBC: 0 % (ref 0.0–0.2)

## 2021-10-07 LAB — COMPREHENSIVE METABOLIC PANEL
ALT: 363 U/L — ABNORMAL HIGH (ref 0–44)
AST: 559 U/L — ABNORMAL HIGH (ref 15–41)
Albumin: 3.9 g/dL (ref 3.5–5.0)
Alkaline Phosphatase: 154 U/L — ABNORMAL HIGH (ref 38–126)
Anion gap: 10 (ref 5–15)
BUN: 15 mg/dL (ref 6–20)
CO2: 25 mmol/L (ref 22–32)
Calcium: 9 mg/dL (ref 8.9–10.3)
Chloride: 100 mmol/L (ref 98–111)
Creatinine, Ser: 0.87 mg/dL (ref 0.61–1.24)
GFR, Estimated: >60 mL/min (ref >60)
Glucose, Bld: 100 mg/dL — ABNORMAL HIGH (ref 70–99)
Potassium: 3.5 mmol/L (ref 3.5–5.1)
Sodium: 135 mmol/L (ref 135–145)
Total Bilirubin: 2 mg/dL — ABNORMAL HIGH (ref 0.3–1.2)
Total Protein: 6.6 g/dL (ref 6.5–8.1)

## 2021-10-07 LAB — URINALYSIS, ROUTINE W REFLEX MICROSCOPIC
Bacteria, UA: NONE SEEN
Bilirubin Urine: NEGATIVE
Glucose, UA: NEGATIVE mg/dL
Hgb urine dipstick: NEGATIVE
Ketones, ur: 20 mg/dL — AB
Leukocytes,Ua: NEGATIVE
Nitrite: NEGATIVE
Protein, ur: 30 mg/dL — AB
Specific Gravity, Urine: 1.02 (ref 1.005–1.030)
pH: 7 (ref 5.0–8.0)

## 2021-10-07 LAB — RAPID URINE DRUG SCREEN, HOSP PERFORMED
Amphetamines: NOT DETECTED
Barbiturates: NOT DETECTED
Benzodiazepines: NOT DETECTED
Cocaine: NOT DETECTED
Opiates: NOT DETECTED
Tetrahydrocannabinol: NOT DETECTED

## 2021-10-07 LAB — LACTIC ACID, PLASMA: Lactic Acid, Venous: 1.3 mmol/L (ref 0.5–1.9)

## 2021-10-07 LAB — LIPASE, BLOOD
Lipase: 23 U/L (ref 11–51)
Lipase: 22 U/L (ref 11–51)

## 2021-10-07 LAB — RESP PANEL BY RT-PCR (FLU A&B, COVID) ARPGX2
Influenza A by PCR: NEGATIVE
Influenza B by PCR: NEGATIVE
SARS Coronavirus 2 by RT PCR: NEGATIVE

## 2021-10-07 LAB — MAGNESIUM: Magnesium: 1.6 mg/dL — ABNORMAL LOW (ref 1.7–2.4)

## 2021-10-07 MED ORDER — ONDANSETRON 4 MG PO TBDP
4.0000 mg | ORAL_TABLET | Freq: Once | ORAL | Status: AC
  Administered 2021-10-07: 4 mg via ORAL
  Filled 2021-10-07: qty 1

## 2021-10-07 MED ORDER — ONDANSETRON HCL 4 MG/2ML IJ SOLN
4.0000 mg | Freq: Once | INTRAMUSCULAR | Status: AC
  Administered 2021-10-07: 4 mg via INTRAVENOUS
  Filled 2021-10-07: qty 2

## 2021-10-07 MED ORDER — ONDANSETRON HCL 4 MG/2ML IJ SOLN
4.0000 mg | Freq: Four times a day (QID) | INTRAMUSCULAR | Status: DC | PRN
  Administered 2021-10-07 – 2021-10-08 (×4): 4 mg via INTRAVENOUS
  Filled 2021-10-07 (×4): qty 2

## 2021-10-07 MED ORDER — IOHEXOL 300 MG/ML  SOLN
100.0000 mL | Freq: Once | INTRAMUSCULAR | Status: AC | PRN
  Administered 2021-10-07: 100 mL via INTRAVENOUS

## 2021-10-07 MED ORDER — PANTOPRAZOLE SODIUM 40 MG IV SOLR
40.0000 mg | Freq: Once | INTRAVENOUS | Status: AC
  Administered 2021-10-07: 40 mg via INTRAVENOUS
  Filled 2021-10-07: qty 40

## 2021-10-07 MED ORDER — SENNOSIDES-DOCUSATE SODIUM 8.6-50 MG PO TABS: 1.0000 | ORAL_TABLET | Freq: Every evening | ORAL | Status: DC | PRN

## 2021-10-07 MED ORDER — OXYCODONE HCL 5 MG PO TABS: 5.0000 mg | ORAL_TABLET | ORAL | Status: DC | PRN

## 2021-10-07 MED ORDER — ONDANSETRON HCL 4 MG PO TABS: 4.0000 mg | ORAL_TABLET | Freq: Four times a day (QID) | ORAL | Status: DC | PRN

## 2021-10-07 MED ORDER — HYDROMORPHONE HCL 1 MG/ML IJ SOLN
0.5000 mg | INTRAMUSCULAR | Status: DC | PRN
  Administered 2021-10-07: 1 mg via INTRAVENOUS
  Filled 2021-10-07: qty 1

## 2021-10-07 MED ORDER — SODIUM CHLORIDE 0.9 % IV SOLN: INTRAVENOUS | Status: DC

## 2021-10-07 MED ORDER — MAGNESIUM SULFATE 2 GM/50ML IV SOLN
2.0000 g | Freq: Once | INTRAVENOUS | Status: AC
  Administered 2021-10-07: 2 g via INTRAVENOUS
  Filled 2021-10-07: qty 50

## 2021-10-07 MED ORDER — LACTATED RINGERS IV BOLUS
1000.0000 mL | Freq: Once | INTRAVENOUS | Status: AC
  Administered 2021-10-07: 1000 mL via INTRAVENOUS

## 2021-10-07 MED ORDER — ENOXAPARIN SODIUM 40 MG/0.4ML IJ SOSY
40.0000 mg | PREFILLED_SYRINGE | INTRAMUSCULAR | Status: DC
  Administered 2021-10-08 – 2021-10-09 (×2): 40 mg via SUBCUTANEOUS
  Filled 2021-10-07 (×2): qty 0.4

## 2021-10-07 MED ORDER — ONDANSETRON HCL 4 MG/2ML IJ SOLN: 4.0000 mg | Freq: Once | INTRAMUSCULAR | Status: DC

## 2021-10-07 MED ORDER — METOCLOPRAMIDE HCL 5 MG/ML IJ SOLN
10.0000 mg | Freq: Once | INTRAMUSCULAR | Status: AC
  Administered 2021-10-07: 10 mg via INTRAVENOUS
  Filled 2021-10-07: qty 2

## 2021-10-07 MED ORDER — SODIUM CHLORIDE 0.9 % IV BOLUS
1000.0000 mL | Freq: Once | INTRAVENOUS | Status: AC
  Administered 2021-10-07: 1000 mL via INTRAVENOUS

## 2021-10-07 MED ORDER — SODIUM CHLORIDE 0.9 % IV SOLN
6.2500 mg | Freq: Four times a day (QID) | INTRAVENOUS | Status: DC | PRN
  Administered 2021-10-08 (×2): 6.25 mg via INTRAVENOUS
  Filled 2021-10-07 (×8): qty 0.25

## 2021-10-07 MED ORDER — HYDROMORPHONE HCL 1 MG/ML IJ SOLN
0.5000 mg | Freq: Once | INTRAMUSCULAR | Status: AC
  Administered 2021-10-07: 0.5 mg via INTRAVENOUS
  Filled 2021-10-07: qty 1
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
## 2021-10-07 NOTE — H&P (Signed)
History and Physical    Albert Long MEQ:[id number] DOB: 1991-08-24 DOA: 10/07/2021  PCP: Patient, No Pcp Per (Inactive)   Patient coming from: Home   Chief Complaint: Abdominal pain, N/V, chills   HPI: Albert Long is a pleasant 31 y.o. male with medical history significant for hepatitis C and daily IV heroin use, now presenting to the emergency department with abdominal pain, subjective fever, nausea, and vomiting.  Patient reports approximately 2 to 3 days of severe nausea with recurrent episodes of nonbloody vomiting, unable to tolerate anything by mouth.  He reports subjective fever and general aches earlier today.  He has also had abdominal pain that is severe at times, mainly on the right side of his abdomen, and without alleviating or exacerbating factors identified.  He denies diarrhea.  Denies sick contacts.  Was using heroin earlier today and does not believe he is withdrawing.  ED Course: Upon arrival to the ED, patient is found to be afebrile, saturating well on room air, slightly tachycardic, and with stable blood pressure.  EKG features sinus tachycardia and chest x-ray negative for acute cardiopulmonary disease.  CT of the abdomen and pelvis notable for splenomegaly and hepatic steatosis.  Right upper quadrant ultrasound with minimal distention of the gallbladder, small volume sludge, but no wall thickening or other acute cholecystitis findings.  Also notable on ultrasound is fatty liver and no biliary dilatation.  Chemistry panel features a magnesium of 1.6, alkaline phosphatase 154, AST 559, ALT 363, and total bilirubin 2.0.  Lipase was normal.  Patient was given 3 L of IV fluids, Dilaudid, Reglan, and 2 doses of Zofran.  Review of Systems:  All other systems reviewed and apart from HPI, are negative.  Past Medical History:  Diagnosis Date   Hepatitis C     History reviewed. No pertinent surgical history.  Social History:   reports that he has been smoking cigarettes.  He has been smoking an average of .25 packs per day. His smokeless tobacco use includes chew. He reports current drug use. Drug: IV. He reports that he does not drink alcohol.  Allergies  Allergen Reactions   Bee Venom Anaphylaxis   Tylenol [Acetaminophen] Other (See Comments)    "Made him cough up blood one year ago"    History reviewed. No pertinent family history.   Prior to Admission medications   Medication Sig Start Date End Date Taking? Authorizing Provider  Multiple Vitamins-Minerals (MULTIVITAMIN ADULTS) TABS Take 1 tablet by mouth daily.   Yes [provider]  cyclobenzaprine (FLEXERIL) 10 MG tablet Take 1 tablet (10 mg total) by mouth 2 (two) times daily as needed for muscle spasms. 09/27/21   Sherrill Raring, PA-C  naproxen (NAPROSYN) 500 MG tablet Take 1 tablet (500 mg total) by mouth 2 (two) times daily. Patient not taking: Reported on 08/15/2018 11/23/14   Comer Locket, PA-C    Physical Exam: Vitals:   10/07/21 1940 10/07/21 2030 10/07/21 2130 10/07/21 2200  BP: (!) 138/111 114/61 111/61 129/71  Pulse: 100 (!) 103 97 96  Resp: $Remo'17 18 17 15  'MunRj$ Temp:      TempSrc:      SpO2: 100% 98% 100% 99%    Constitutional: NAD, calm  Eyes: PERTLA, lids and conjunctivae normal ENMT: Mucous membranes are moist. Posterior pharynx clear of any exudate or lesions.   Neck: supple, no masses  Respiratory: no wheezing, no crackles. No accessory muscle use.  Cardiovascular: S1 & S2 heard, regular rate and rhythm. No extremity  edema.   Abdomen: soft, tender on right, no rebound pain or guarding. Bowel sounds active.  Musculoskeletal: no clubbing / cyanosis. No joint deformity upper and lower extremities.   Skin: Multiple puncture marks on arms. Warm, dry, well-perfused. Neurologic: CN 2-12 grossly intact. Moving all extremities. Alert and oriented.  Psychiatric: Calm. Cooperative.    Labs and Imaging on Admission: I have personally reviewed following labs and imaging  studies  CBC: Recent Labs  Lab 10/07/21 1912  WBC 7.8  NEUTROABS 7.0  HGB 13.6  HCT 38.6*  MCV 83.5  PLT 660   Basic Metabolic Panel: Recent Labs  Lab 10/07/21 1912  NA 135  K 3.5  CL 100  CO2 25  GLUCOSE 100*  BUN 15  CREATININE 0.87  CALCIUM 9.0  MG 1.6*   GFR: CrCl cannot be calculated (Unknown ideal weight.). Liver Function Tests: Recent Labs  Lab 10/07/21 1912  AST 559*  ALT 363*  ALKPHOS 154*  BILITOT 2.0*  PROT 6.6  ALBUMIN 3.9   Recent Labs  Lab 10/07/21 1912 10/07/21 1947  LIPASE 23 22   No results for input(s): AMMONIA in the last 168 hours. Coagulation Profile: No results for input(s): INR, PROTIME in the last 168 hours. Cardiac Enzymes: No results for input(s): CKTOTAL, CKMB, CKMBINDEX, TROPONINI in the last 168 hours. BNP (last 3 results) No results for input(s): PROBNP in the last 8760 hours. HbA1C: No results for input(s): HGBA1C in the last 72 hours. CBG: No results for input(s): GLUCAP in the last 168 hours. Lipid Profile: No results for input(s): CHOL, HDL, LDLCALC, TRIG, CHOLHDL, LDLDIRECT in the last 72 hours. Thyroid Function Tests: No results for input(s): TSH, T4TOTAL, FREET4, T3FREE, THYROIDAB in the last 72 hours. Anemia Panel: No results for input(s): VITAMINB12, FOLATE, FERRITIN, TIBC, IRON, RETICCTPCT in the last 72 hours. Urine analysis:    Component Value Date/Time   COLORURINE AMBER (A) 10/07/2021 1835   APPEARANCEUR CLEAR 10/07/2021 1835   LABSPEC 1.020 10/07/2021 [place] 7.0 10/07/2021 1835   GLUCOSEU NEGATIVE 10/07/2021 1835   HGBUR NEGATIVE 10/07/2021 [place] 10/07/2021 1835   KETONESUR 20 (A) 10/07/2021 1835   PROTEINUR 30 (A) 10/07/2021 1835   UROBILINOGEN 0.2 03/10/2010 1050   NITRITE NEGATIVE 10/07/2021 [place] 10/07/2021 1835   Sepsis Labs: $RemoveBefo'@[id number]$ (procalcitonin:4,lacticidven:4) ) Recent Results (from the past 240 hour(s))  Resp Panel by RT-PCR  (Flu A&B, Covid) Nasopharyngeal Swab     Status: None   Collection Time: 10/07/21  6:36 PM   Specimen: Nasopharyngeal Swab; Nasopharyngeal(NP) swabs in vial transport medium  Result Value Ref Range Status   SARS Coronavirus 2 by RT PCR NEGATIVE NEGATIVE Final    Comment: (NOTE) SARS-CoV-2 target nucleic acids are NOT DETECTED.  The SARS-CoV-2 RNA is generally detectable in upper respiratory specimens during the acute phase of infection. The lowest concentration of SARS-CoV-2 viral copies this assay can detect is 138 copies/mL. A negative result does not preclude SARS-Cov-2 infection and should not be used as the sole basis for treatment or other patient management decisions. A negative result may occur with  improper specimen collection/handling, submission of specimen other than nasopharyngeal swab, presence of viral mutation(s) within the areas targeted by this assay, and inadequate number of viral copies(<138 copies/mL). A negative result must be combined with clinical observations, patient history, and epidemiological information. The expected result is Negative.  Fact Sheet for Patients:  EntrepreneurPulse.com.au  Fact Sheet for Healthcare Providers:  IncredibleEmployment.be  This test is no t yet approved or cleared by the [place] and  has been authorized for detection and/or diagnosis of SARS-CoV-2 by FDA under an Emergency Use Authorization (EUA). This EUA will remain  in effect (meaning this test can be used) for the duration of the COVID-19 declaration under Section 564(b)(1) of the Act, 21 U.S.C.section 360bbb-3(b)(1), unless the authorization is terminated  or revoked sooner.       Influenza A by PCR NEGATIVE NEGATIVE Final   Influenza B by PCR NEGATIVE NEGATIVE Final    Comment: (NOTE) The Xpert Xpress SARS-CoV-2/FLU/RSV plus assay is intended as an aid in the diagnosis of influenza from Nasopharyngeal swab specimens  and should not be used as a sole basis for treatment. Nasal washings and aspirates are unacceptable for Xpert Xpress SARS-CoV-2/FLU/RSV testing.  Fact Sheet for Patients: EntrepreneurPulse.com.au  Fact Sheet for Healthcare Providers: IncredibleEmployment.be  This test is not yet approved or cleared by the [place] FDA and has been authorized for detection and/or diagnosis of SARS-CoV-2 by FDA under an Emergency Use Authorization (EUA). This EUA will remain in effect (meaning this test can be used) for the duration of the COVID-19 declaration under Section 564(b)(1) of the Act, 21 U.S.C. section 360bbb-3(b)(1), unless the authorization is terminated or revoked.  Performed at [facility], [place] [address] [place], [place] [address]      Radiological Exams on Admission: DG Chest 2 View  Result Date: 10/07/2021 CLINICAL DATA:  chest pain EXAM: CHEST - 2 VIEW COMPARISON:  Chest x-ray 02/21/2021 FINDINGS: The heart and mediastinal contours are within normal limits. No focal consolidation. No pulmonary edema. No pleural effusion. No pneumothorax. No acute osseous abnormality. IMPRESSION: No active cardiopulmonary disease. Electronically Signed   By: Iven Finn M.D.   On: 10/07/2021 19:07   CT Abdomen Pelvis W Contrast  Result Date: 10/07/2021 CLINICAL DATA:  Abdominal pain, acute, nonlocalized EXAM: CT ABDOMEN AND PELVIS WITH CONTRAST TECHNIQUE: Multidetector CT imaging of the abdomen and pelvis was performed using the standard protocol following bolus administration of intravenous contrast. RADIATION DOSE REDUCTION: This exam was performed according to the departmental dose-optimization program which includes automated exposure control, adjustment of the mA and/or kV according to patient size and/or use of iterative reconstruction technique. CONTRAST:  157mL OMNIPAQUE IOHEXOL 300 MG/ML  SOLN COMPARISON:  September 27, 2021  FINDINGS: Lower chest: Unchanged pulmonary nodule in the peripheral LEFT lower lobe, likely sequela of prior infection. No acute abnormality. Hepatobiliary: Hepatic steatosis. Gallbladder is unremarkable. No focal hepatic lesion identified. Portal vein is patent. Pancreas: Unremarkable. No pancreatic ductal dilatation or surrounding inflammatory changes. Spleen: Splenomegaly measuring approximately 16 cm Adrenals/Urinary Tract: Adrenal glands are unremarkable. Kidneys enhance symmetrically. No hydronephrosis. No obstructing nephrolithiasis. Bladder is unremarkable. Stomach/Bowel: No evidence of bowel obstruction. Moderate colonic stool burden diffusely throughout the colon. Appendix is decompressed and unremarkable. Stomach is unremarkable. Vascular/Lymphatic: No significant vascular findings are present. No enlarged abdominal or pelvic lymph nodes. Reproductive: Prostate is unremarkable. Other: No abdominal wall hernia or abnormality. No abdominopelvic ascites. Musculoskeletal: No acute or significant osseous findings. IMPRESSION: 1. No suspicious CT etiology for acute abdominal pain identified. Moderate colonic stool burden diffusely throughout the colon. 2.  Splenomegaly. 3.  Hepatic steatosis. Electronically Signed   By: Valentino Saxon M.D.   On: 10/07/2021 21:02   US Abdomen Limited RUQ (LIVER/GB)  Result Date: 10/07/2021 CLINICAL DATA:  Elevated liver enzymes and abdominal pain. EXAM: ULTRASOUND ABDOMEN LIMITED RIGHT UPPER QUADRANT COMPARISON:  CT with contrast earlier today. FINDINGS: Gallbladder: There is a minimally distended gallbladder with small volume of sludge in the neck. There are no shadowing stones or wall thickening. The technologist did not indicate whether or not the patient had a positive sonographic Murphy's sign. No pericholecystic fluid is seen. Common bile duct: Diameter: 3.8 mm Liver: No focal lesion identified. There is increased hepatic echogenicity consistent with the mild  steatosis seen on CT. Liver is 18 cm length. Portal vein is patent on color Doppler imaging with normal direction of blood flow towards the liver. Other: None. IMPRESSION: 1. Minimally distended gallbladder with small volume of sludge but no shadowing stones or wall thickening. No sonographic findings of acute cholecystitis. 2. Fatty liver. 3. No intrahepatic or extrahepatic biliary dilatation. Electronically Signed   By: Telford Nab M.D.   On: 10/07/2021 21:53    EKG: Independently reviewed. Sinus tachycardia, rate 105.   Assessment/Plan   1. Intractable N/V - Presents with 2-3 days of abdominal pain and N/V, reports subjective fever at home  - No peritoneal signs and acute findings on CT, continues to complain of severe nausea despite multiple doses antiemetics in ED  - Continue IVF hydration, continue antiemetics, monitor electrolytes, start clears as tolerated and advance diet as he improves   2. Elevated LFTs  - Presents with abdominal pain, subjective fever, and N/V and found to have alk phos 154, AST 559, ALT 363, and total bili 2.0   - [place] with fatty liver, minimal gall bladder distension with small volume of sludge but no findings of acute cholecystitis, and no biliary dilatation  - Check APAP level, viral hepatitis panel, and INR, repeat CMP in am    3. Opiate use disorder  - Daily IV heroin user, in pain but not in withdrawal on admission  - Treating pain for now, monitor for withdrawal and consider buprenorphine if he starts withdrawing    DVT prophylaxis: Lovenox  Code Status: Full  Level of Care: Level of care: Med-Surg Family Communication: Significant other at bedside  Disposition Plan:  Patient is from: Home  Anticipated d/c is to: Home  Anticipated d/c date is: Possibly as early as 10/08/21 Patient currently: pending tolerance of adequate oral intake, repeat CMP  Consults called: none  Admission status: Observation     Vianne Bulls, MD [facility]  10/07/2021, 11:21 PM

## 2021-10-07 NOTE — ED Triage Notes (Signed)
Patient reports fever and multiple episodes of vomiting today. Patient reports using heroin, last use was yesterday. He reports using daily. Denies sick contacts.

## 2021-10-07 NOTE — ED Provider Triage Note (Signed)
Emergency Medicine Provider Triage Evaluation Note  Albert Long , a 31 y.o. male  was evaluated in triage.  Pt complains of fever and multiple episodes of vomiting.  No diarrhea. He reports shortness of breath.   He reports whole body pain.   This started today.  He last injected heroin yesterday.  He feels this isn't withdrawal.  Subjective fevers at home.    Physical Exam  BP 130/83 (BP Location: Right Arm)    Pulse (!) 109    Temp 97.8 F (36.6 C) (Oral)    Resp 18    SpO2 95%  Gen:   Awake, pale, appears unwell, is actively dry heaving in the room Resp:  Normal effort  MSK:   Moves extremities without difficulty  Other:  Normal speech.   Medical Decision Making  Medically screening exam initiated at 6:32 PM.  Appropriate orders placed.  Kayren Eaves was informed that the remainder of the evaluation will be completed by another provider, this initial triage assessment does not replace that evaluation, and the importance of remaining in the ED until their evaluation is complete.  Patient injects heroin, subjective fevers at home, last use was yesterday however states this feels different than withdrawal. Will check labs, urine including UDS.  EKG is ordered along with chest x-ray given his chest pain. Will check COVID swab.  Zofran is ordered for nausea and vomiting.   Cristina Gong, [place] 10/07/21 1839

## 2021-10-08 DIAGNOSIS — R161 Splenomegaly, not elsewhere classified: Secondary | ICD-10-CM | POA: Diagnosis present

## 2021-10-08 DIAGNOSIS — K760 Fatty (change of) liver, not elsewhere classified: Secondary | ICD-10-CM | POA: Diagnosis present

## 2021-10-08 DIAGNOSIS — K5900 Constipation, unspecified: Principal | ICD-10-CM

## 2021-10-08 LAB — HEPATITIS PANEL, ACUTE
HCV Ab: REACTIVE — AB
Hep A IgM: NONREACTIVE
Hep B C IgM: NONREACTIVE
Hepatitis B Surface Ag: NONREACTIVE

## 2021-10-08 LAB — COMPREHENSIVE METABOLIC PANEL
ALT: 315 U/L — ABNORMAL HIGH (ref 0–44)
AST: 305 U/L — ABNORMAL HIGH (ref 15–41)
Albumin: 3.7 g/dL (ref 3.5–5.0)
Alkaline Phosphatase: 133 U/L — ABNORMAL HIGH (ref 38–126)
Anion gap: 8 (ref 5–15)
BUN: 13 mg/dL (ref 6–20)
CO2: 24 mmol/L (ref 22–32)
Calcium: 8.9 mg/dL (ref 8.9–10.3)
Chloride: 104 mmol/L (ref 98–111)
Creatinine, Ser: 0.8 mg/dL (ref 0.61–1.24)
GFR, Estimated: >60 mL/min (ref >60)
Glucose, Bld: 119 mg/dL — ABNORMAL HIGH (ref 70–99)
Potassium: 3.5 mmol/L (ref 3.5–5.1)
Sodium: 136 mmol/L (ref 135–145)
Total Bilirubin: 1.1 mg/dL (ref 0.3–1.2)
Total Protein: 6.6 g/dL (ref 6.5–8.1)

## 2021-10-08 LAB — CBC
HCT: 39 % (ref 39.0–52.0)
Hemoglobin: 13.5 g/dL (ref 13.0–17.0)
MCH: 29.1 pg (ref 26.0–34.0)
MCHC: 34.6 g/dL (ref 30.0–36.0)
MCV: 84.1 fL (ref 80.0–100.0)
Platelets: 190 10*3/uL (ref 150–400)
RBC: 4.64 MIL/uL (ref 4.22–5.81)
RDW: 12.5 % (ref 11.5–15.5)
WBC: 13.3 10*3/uL — ABNORMAL HIGH (ref 4.0–10.5)
nRBC: 0 % (ref 0.0–0.2)

## 2021-10-08 LAB — MAGNESIUM: Magnesium: 2.6 mg/dL — ABNORMAL HIGH (ref 1.7–2.4)

## 2021-10-08 LAB — ACETAMINOPHEN LEVEL: Acetaminophen (Tylenol), Serum: <10 ug/mL — ABNORMAL LOW (ref 10–30)

## 2021-10-08 LAB — HIV ANTIBODY (ROUTINE TESTING W REFLEX): HIV Screen 4th Generation wRfx: NONREACTIVE

## 2021-10-08 LAB — PROTIME-INR
INR: 1.2 (ref 0.8–1.2)
Prothrombin Time: 15 seconds (ref 11.4–15.2)

## 2021-10-08 MED ORDER — POLYETHYLENE GLYCOL 3350 17 G PO PACK
17.0000 g | PACK | Freq: Every day | ORAL | Status: DC
  Administered 2021-10-08 – 2021-10-09 (×2): 17 g via ORAL
  Filled 2021-10-08 (×2): qty 1

## 2021-10-08 MED ORDER — LACTATED RINGERS IV SOLN: INTRAVENOUS | Status: DC

## 2021-10-08 MED ORDER — SENNOSIDES-DOCUSATE SODIUM 8.6-50 MG PO TABS
2.0000 | ORAL_TABLET | Freq: Two times a day (BID) | ORAL | Status: DC
  Administered 2021-10-08 – 2021-10-09 (×3): 2 via ORAL
  Filled 2021-10-08 (×3): qty 2

## 2021-10-08 MED ORDER — BISACODYL 10 MG RE SUPP
10.0000 mg | Freq: Once | RECTAL | Status: AC
  Administered 2021-10-08: 10 mg via RECTAL
  Filled 2021-10-08: qty 1

## 2021-10-08 MED ORDER — KETOROLAC TROMETHAMINE 30 MG/ML IJ SOLN
30.0000 mg | Freq: Four times a day (QID) | INTRAMUSCULAR | Status: DC
  Administered 2021-10-08 – 2021-10-09 (×6): 30 mg via INTRAVENOUS
  Filled 2021-10-08 (×7): qty 1

## 2021-10-08 MED ORDER — DICYCLOMINE HCL 10 MG PO CAPS
10.0000 mg | ORAL_CAPSULE | Freq: Three times a day (TID) | ORAL | Status: DC
  Administered 2021-10-08 – 2021-10-09 (×4): 10 mg via ORAL
  Filled 2021-10-08 (×5): qty 1

## 2021-10-08 MED ORDER — OXYCODONE HCL 5 MG PO TABS
5.0000 mg | ORAL_TABLET | ORAL | Status: DC | PRN
  Administered 2021-10-08: 10 mg via ORAL
  Administered 2021-10-08: 5 mg via ORAL
  Administered 2021-10-08 – 2021-10-09 (×3): 10 mg via ORAL
  Filled 2021-10-08 (×2): qty 1
  Filled 2021-10-08 (×2): qty 2
  Filled 2021-10-08: qty 1
  Filled 2021-10-08: qty 2
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
## 2021-10-08 NOTE — Assessment & Plan Note (Signed)
LFTs severely elevated. AST currently trending down from 559-305. ALT 3 63-3 1 5.  Normal total bilirubin. Hepatitis C antibody positive. INR normal.  Tylenol level undetectable. Suspect this is shock liver. Monitor for telemetry. Ultrasound abdomen negative for any acute abnormality. CT abdomen negative for acute abnormality as well.
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
## 2021-10-08 NOTE — Assessment & Plan Note (Signed)
Seen on the CT scan.  Monitor.  °

## 2021-10-08 NOTE — Assessment & Plan Note (Signed)
Last use 10/06/2021. So far no evidence of withdrawal. Avoid IV narcotics.

## 2021-10-08 NOTE — Plan of Care (Signed)
  Problem: Education: Goal: Knowledge of General Education information will improve Description Including pain rating scale, medication(s)/side effects and non-pharmacologic comfort measures Outcome: Progressing   

## 2021-10-09 LAB — CBC
HCT: 34.9 % — ABNORMAL LOW (ref 39.0–52.0)
Hemoglobin: 12 g/dL — ABNORMAL LOW (ref 13.0–17.0)
MCH: 28.8 pg (ref 26.0–34.0)
MCHC: 34.4 g/dL (ref 30.0–36.0)
MCV: 83.9 fL (ref 80.0–100.0)
Platelets: 191 10*3/uL (ref 150–400)
RBC: 4.16 MIL/uL — ABNORMAL LOW (ref 4.22–5.81)
RDW: 12.6 % (ref 11.5–15.5)
WBC: 8.8 10*3/uL (ref 4.0–10.5)
nRBC: 0 % (ref 0.0–0.2)

## 2021-10-09 LAB — COMPREHENSIVE METABOLIC PANEL
ALT: 168 U/L — ABNORMAL HIGH (ref 0–44)
AST: 69 U/L — ABNORMAL HIGH (ref 15–41)
Albumin: 3.3 g/dL — ABNORMAL LOW (ref 3.5–5.0)
Alkaline Phosphatase: 90 U/L (ref 38–126)
Anion gap: 6 (ref 5–15)
BUN: 10 mg/dL (ref 6–20)
CO2: 25 mmol/L (ref 22–32)
Calcium: 8.9 mg/dL (ref 8.9–10.3)
Chloride: 107 mmol/L (ref 98–111)
Creatinine, Ser: 0.8 mg/dL (ref 0.61–1.24)
GFR, Estimated: >60 mL/min (ref >60)
Glucose, Bld: 112 mg/dL — ABNORMAL HIGH (ref 70–99)
Potassium: 3.3 mmol/L — ABNORMAL LOW (ref 3.5–5.1)
Sodium: 138 mmol/L (ref 135–145)
Total Bilirubin: 0.8 mg/dL (ref 0.3–1.2)
Total Protein: 6.3 g/dL — ABNORMAL LOW (ref 6.5–8.1)

## 2021-10-09 MED ORDER — HYDROXYZINE HCL 25 MG PO TABS
25.0000 mg | ORAL_TABLET | Freq: Three times a day (TID) | ORAL | Status: DC
  Administered 2021-10-09: 25 mg via ORAL
  Filled 2021-10-09: qty 1

## 2021-10-09 MED ORDER — POTASSIUM CHLORIDE CRYS ER 20 MEQ PO TBCR
40.0000 meq | EXTENDED_RELEASE_TABLET | Freq: Once | ORAL | Status: AC
  Administered 2021-10-09: 40 meq via ORAL
  Filled 2021-10-09: qty 2

## 2021-10-09 MED ORDER — PROCHLORPERAZINE EDISYLATE 10 MG/2ML IJ SOLN
10.0000 mg | Freq: Once | INTRAMUSCULAR | Status: AC
  Administered 2021-10-09: 10 mg via INTRAVENOUS
  Filled 2021-10-09: qty 2

## 2021-10-09 MED ORDER — DIPHENHYDRAMINE HCL 50 MG/ML IJ SOLN
25.0000 mg | Freq: Once | INTRAMUSCULAR | Status: AC
  Administered 2021-10-09: 25 mg via INTRAVENOUS
  Filled 2021-10-09: qty 1

## 2021-10-09 MED ORDER — CYCLOBENZAPRINE HCL 10 MG PO TABS
10.0000 mg | ORAL_TABLET | Freq: Three times a day (TID) | ORAL | Status: DC
  Administered 2021-10-09: 10 mg via ORAL
  Filled 2021-10-09: qty 1

## 2021-10-09 NOTE — Plan of Care (Signed)

## 2021-10-09 NOTE — Discharge Instructions (Signed)
?  Outpatient Substance Use Treatment Services  ? ?[facility]  ?[facility] ?[address] ?[place], [place] [address]  ?[phone] ?Private insurance, Medicare A&B, and GCCN  ? ?[facility]  ?[address]  ?[place], [place] [address] ?[phone] ?Medicaid, Self Pay  ? ?[facility]      ?[address]  ?[place], [place] ?[phone] ?Medicaid and Private Insurance, Self Pay  ? ?[facility] ?[address]  ?[place], [place]  ?[phone] ?Private Insurance, and Self Pay ? ?[facility]      ?[address]    ?[place], [place] [address]  ?[phone] or [phone] ?Private Insurance Only  ? ? ?[facility] ?[address]  ?[place], [place] [address] ?[phone] ?Medicaid, Medicare, Private Insurance ? ?[facility] ?[address]  ?[place], [place] [address] ?[phone] ?Services are free or reduced ? ?[facility]  ?[address] ?[phone]  ?Self Pay only, sliding scale ? ?[facility]  ?[address]  ?[place], [place] [address] ?[phone] ?([facility]) ?Self Pay, Medicaid Only  ? ?[facility] ?[address]  ?[place], [place] [address] ?[phone] ?Medicaid, Medicare, Private Insurance ? ? ? ? ?Residential Substance Use Treatment Services  ? ?[facility]  ?[address]  ?[place], [place] [address]  ?[phone] or [phone] ?Detox (Medicare, Medicaid, private insurance, and self pay)  ?Residential Rehab 14 days (Medicare, Medicaid, private insurance, and self pay)  ? ?[facility]  ?[address] [place], [place]  ?[phone]  ?Male and Male Detox (Self Pay and Medicaid limited availability)  ?Rehab only Male (Medicaid and self pay only)  ? ?[facility]      ?[address]  ?[place], [place] [address]  ?[phone] or [phone] ?Detox and Residential Treatment ?Private Insurance Only  ? ?[facility]  ?[address]  ?[place], [place] [address]  ?[phone]  ?Treatment Only, must make assessment appointment, and must be sober for assessment appointment.  ?Self Pay Only, Medicare A&B, [place], [place] only! ?*Transportation assistance offered from [employer] [address] ? ?[facility]     ?[address] ?[place], [place] [address] ?Walk in interviews M-Sat 8-4p ?No pending legal charges ?[phone] ? ? ? ? ?[facility]:  [facility] ?Referral  ?[address] ?[place], [place] ?[phone] ?(Self Pay, Medicaid) ? ?[facility] ?[address] ?[place], [place] [address] ?[phone] ?Detox and Residential Treatment ?Medicare and Private Insurance ? ?[facility] ?[address]  ?[place], [place] [address] ?28 Day Women's Facility: [phone] ?28 Day Men's Facility: [phone] ?Long-term Residential Program:  ?[phone] Males 25 and Over ?(No Insurance, upfront fee) ? ?[facility]  ?[address] ?[place], [place] [address] ?[phone] ?Private Insurance with [employer], Private Pay ? ?[facility] ?[address] ?[place], [place] [address] ?Local [phone] ?Private Insurance Only ? ?[facility] ?[address]  ?[place], [place] [address]  ?[phone] ?(Males, upfront fee) ? ?[facility] [place] ?[address]  ?[place], [address] ?[phone] ?Private Insurance  ?

## 2021-10-09 NOTE — Progress Notes (Signed)
Chaplain engaged in an initial visit with "Albert Long."  He voiced that he wasn't feeling well today.  Chaplain offered her support and just let him know she was there if he needed to talk.  Girlfriend was present in the room but was sleeping.  When Chaplain asked if Albert Long would be interested in any programs to help him he stated, "I'm open to talking with somebody."  Chaplain also asked if he held any particular religious beliefs or affiliations and he stated, "No."  Chaplain simply worked to begin conversation and offer community.    Chaplain could assess that Albert Long was not feeling well as he was scrunched up in a ball with the lights off.  Chaplain will continue to check in on him. Chaplain will talk to Nurse or Social Work about inputting consult for resources.     10/09/21 0900  Clinical Encounter Type  Visited With Patient and family together  Visit Type Initial  Referral From Nurse  Consult/Referral To Chaplain  Stress Factors  Patient Stress Factors Health changes

## 2021-10-09 NOTE — Progress Notes (Addendum)
NT rounded on Pt's room at 18:55, Pt and significant other both not in the room, all belongings are gone. Charge RN, [employer], security and MD notified. Pt had a patent [id number] PIV to [facility]. Tried to reach Pt and family, no answer.

## 2021-10-09 NOTE — TOC Initial Note (Signed)
Transition of Care [facility]) - Initial/Assessment Note   Patient Details  Name: Albert Long MRN: [id number] Date of Birth: 09/13/1990  Transition of Care [facility]) CM/SW Contact:    Ewing Schlein, LCSW Phone Number: 10/09/2021, 12:46 PM  Clinical Narrative: [facility] consulted for substance use resources. CSW spoke with patient who is agreeable to resources. Resources added to AVS.  Expected Discharge Plan: Home/Self Care Barriers to Discharge: Inadequate or no insurance, Continued Medical Work up  Patient Goals and CMS Choice Choice offered to / list presented to : NA  Expected Discharge Plan and Services Expected Discharge Plan: Home/Self Care In-house Referral: Clinical Social Work Post Acute Care Choice: NA Living arrangements for the past 2 months: Single Family Home          DME Arranged: N/A DME Agency: NA  Prior Living Arrangements/Services Living arrangements for the past 2 months: Single Family Home Patient language and need for interpreter reviewed:: Yes Do you feel safe going back to the place where you live?: Yes      Need for Family Participation in Patient Care: No (Comment) Care giver support system in place?: Yes (comment) Criminal Activity/Legal Involvement Pertinent to Current Situation/Hospitalization: No - Comment as needed  Activities of Daily Living Home Assistive Devices/Equipment: None ADL Screening (condition at time of admission) Patient's cognitive ability adequate to safely complete daily activities?: Yes Is the patient deaf or have difficulty hearing?: No Does the patient have difficulty seeing, even when wearing glasses/contacts?: No Does the patient have difficulty concentrating, remembering, or making decisions?: No Patient able to express need for assistance with ADLs?: Yes Does the patient have difficulty dressing or bathing?: Yes Independently performs ADLs?: Yes (appropriate for developmental age) Does the patient have difficulty walking or  climbing stairs?: No Weakness of Legs: None Weakness of Arms/Hands: None  Emotional Assessment Appearance:: Appears stated age Attitude/Demeanor/Rapport: Engaged Affect (typically observed): Appropriate Orientation: : Oriented to Self, Oriented to Place, Oriented to  Time, Oriented to Situation Alcohol / Substance Use: Illicit Drugs Psych Involvement: No (comment)  Admission diagnosis:  Dehydration [E86.0] Pain [R52] Elevated liver function tests [R79.89] Intractable nausea and vomiting [R11.2] Heroin use disorder, severe, 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] Constipation [K59.00] Patient Active Problem List   Diagnosis Date Noted   Constipation 10/08/2021   Hepatic steatosis 10/08/2021   Splenomegaly 10/08/2021   Intractable nausea and vomiting 10/07/2021   Elevated LFTs 10/07/2021   Heroin use disorder, severe, dependence (HCC) 10/07/2021   PCP:  Patient, No Pcp Per (Inactive) Pharmacy:   [id number] [place], [place] - [address] AT [facility] [address] [place] [address] Phone: [phone] Fax: [phone]  Readmission Risk Interventions No flowsheet data found.
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
## 2021-10-11 LAB — HCV RT-PCR, QUANT (NON-GRAPH)
HCV log10: 6.554 log10 IU/mL
Hepatitis C Quantitation: 3580000 IU/mL

## 2021-10-11 LAB — HCV AB W REFLEX TO QUANT PCR: HCV Ab: >11 s/co ratio — ABNORMAL HIGH (ref 0.0–0.9)

## 2023-02-16 IMAGING — CR DG CHEST 2V
2 series · 2 of 2 positions shown · non-contrast
Comparison: 07/21/2018

CLINICAL DATA: Dyspnea for the past few months. Intermittent chest
pain.

EXAM:
CHEST - 2 VIEW

[chest pa]
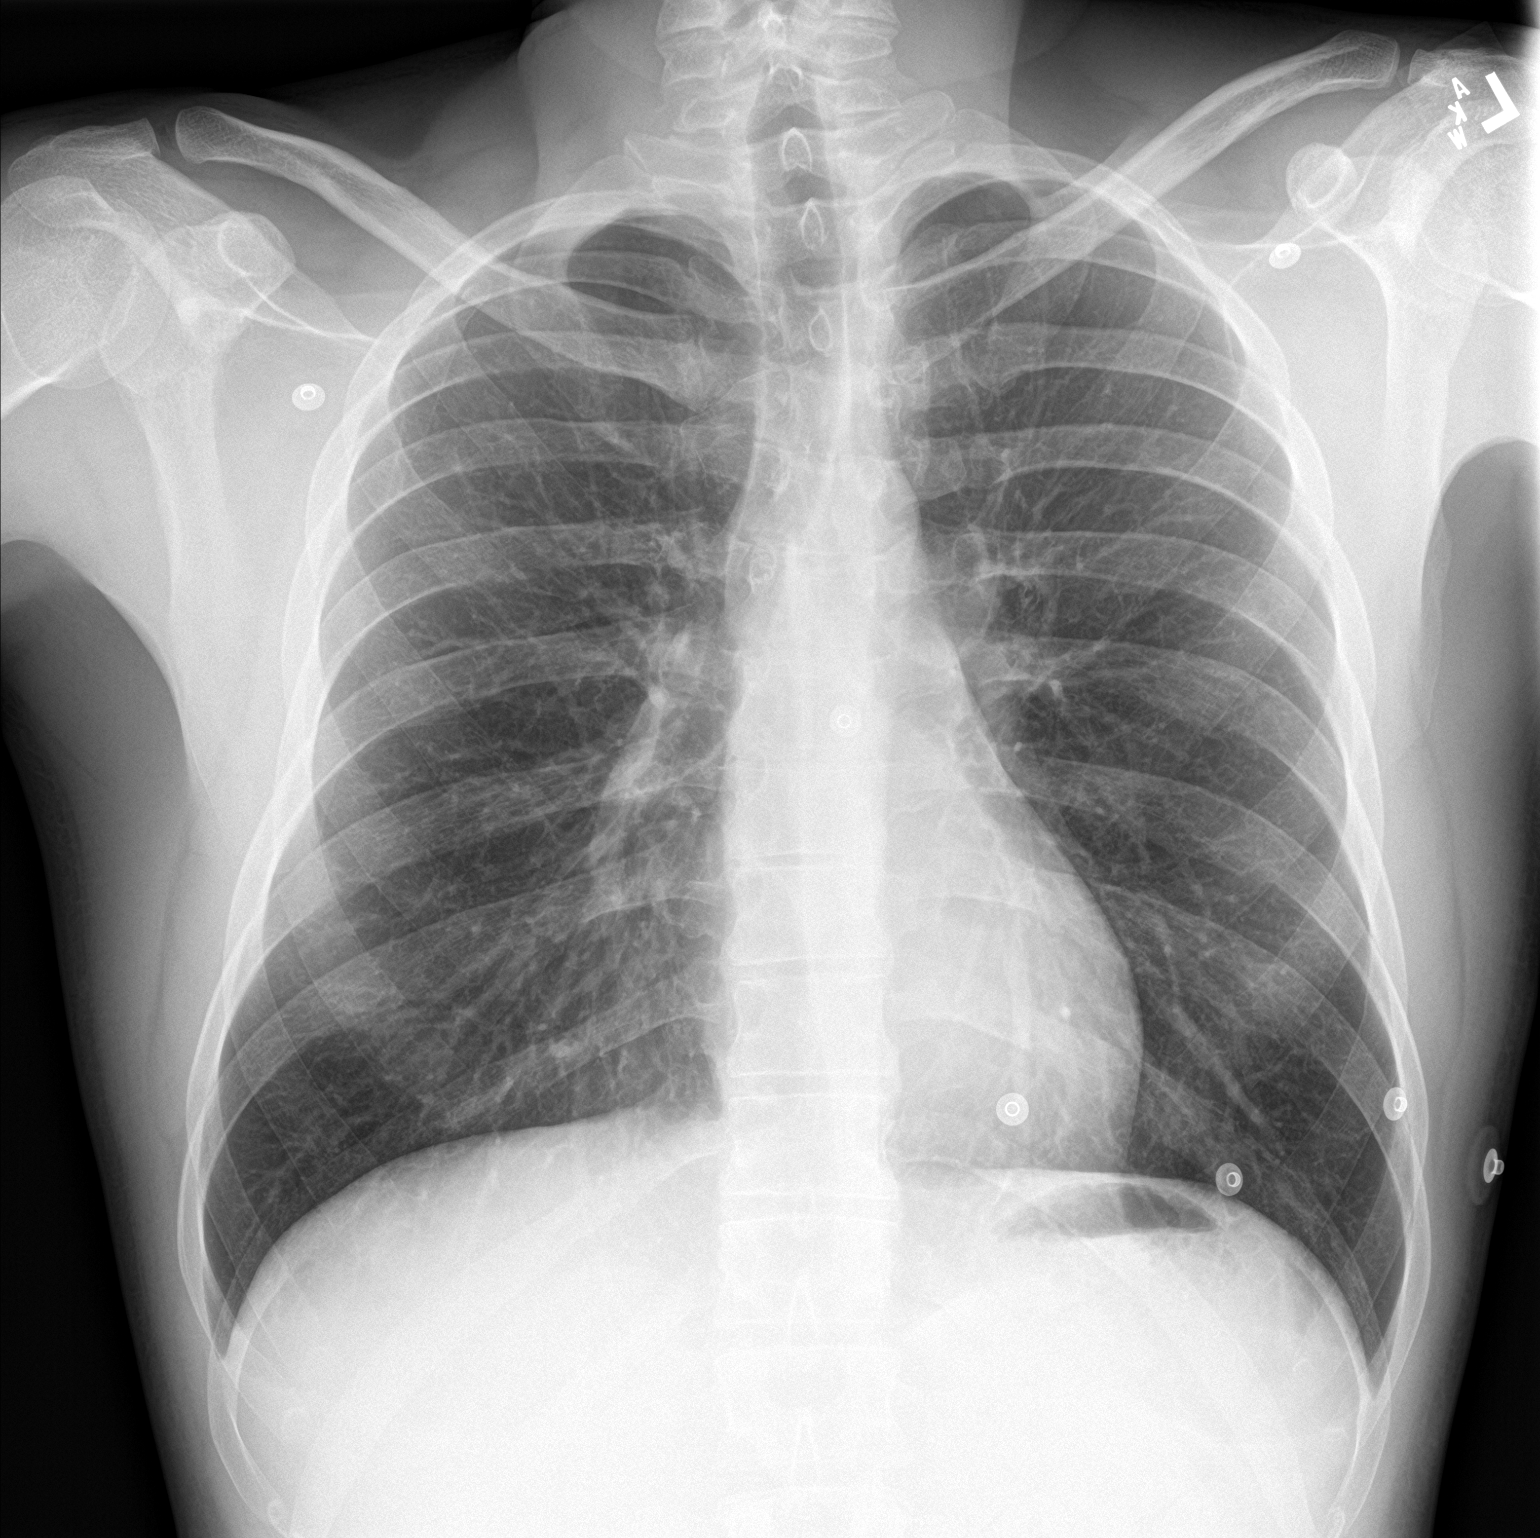

[chest lat]
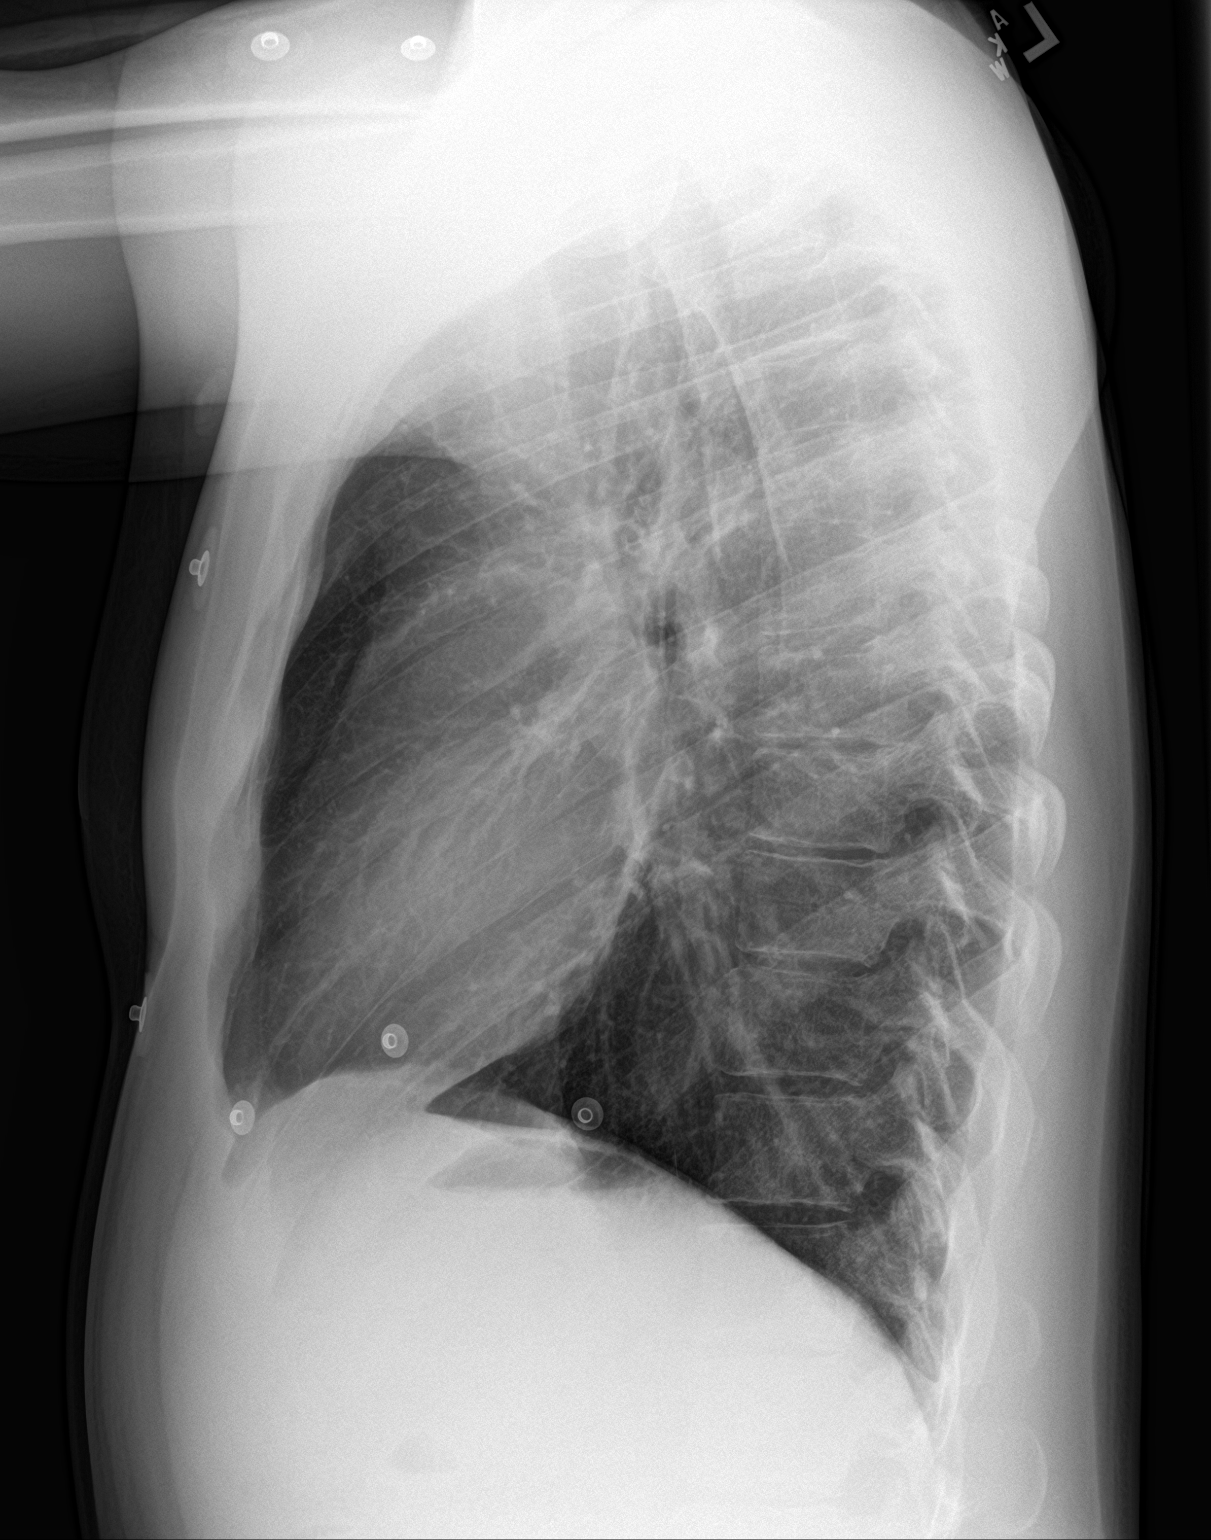

[2 of 2 positions shown; findings below may reference images not displayed]

FINDINGS: The heart size and mediastinal contours are within normal limits.
Both lungs are clear. The visualized skeletal structures are
unremarkable.
IMPRESSION: No active cardiopulmonary disease.
